# Patient Record
Sex: Female | Born: 1985 | Race: White | Hispanic: Yes | Marital: Married | Smoking: Never smoker
Health system: Southern US, Community
[De-identification: ages and names within clinical notes are randomized; demographics above are authoritative.]

## PROBLEM LIST (undated history)

## (undated) ENCOUNTER — Inpatient Hospital Stay (HOSPITAL_COMMUNITY): Payer: Self-pay

## (undated) DIAGNOSIS — H547 Unspecified visual loss: Secondary | ICD-10-CM

## (undated) DIAGNOSIS — G932 Benign intracranial hypertension: Secondary | ICD-10-CM

## (undated) DIAGNOSIS — R519 Headache, unspecified: Secondary | ICD-10-CM

## (undated) DIAGNOSIS — H9319 Tinnitus, unspecified ear: Secondary | ICD-10-CM

## (undated) DIAGNOSIS — R51 Headache: Principal | ICD-10-CM

## (undated) HISTORY — DX: Benign intracranial hypertension: G93.2

## (undated) HISTORY — DX: Unspecified visual loss: H54.7

## (undated) HISTORY — PX: OTHER SURGICAL HISTORY: SHX169

## (undated) HISTORY — DX: Headache: R51

## (undated) HISTORY — DX: Tinnitus, unspecified ear: H93.19

## (undated) HISTORY — DX: Headache, unspecified: R51.9

---

## 2006-06-11 ENCOUNTER — Emergency Department (HOSPITAL_COMMUNITY): Admission: EM | Admit: 2006-06-11 | Discharge: 2006-06-11 | Payer: Self-pay | Admitting: Emergency Medicine

## 2006-09-15 ENCOUNTER — Emergency Department (HOSPITAL_COMMUNITY): Admission: EM | Admit: 2006-09-15 | Discharge: 2006-09-16 | Payer: Self-pay | Admitting: Emergency Medicine

## 2006-09-16 ENCOUNTER — Emergency Department (HOSPITAL_COMMUNITY): Admission: EM | Admit: 2006-09-16 | Discharge: 2006-09-17 | Payer: Self-pay | Admitting: Emergency Medicine

## 2006-09-18 ENCOUNTER — Inpatient Hospital Stay (HOSPITAL_COMMUNITY): Admission: AD | Admit: 2006-09-18 | Discharge: 2006-09-18 | Payer: Self-pay | Admitting: Gynecology

## 2011-06-17 HISTORY — PX: OTHER SURGICAL HISTORY: SHX169

## 2012-01-21 ENCOUNTER — Emergency Department (HOSPITAL_COMMUNITY)
Admission: EM | Admit: 2012-01-21 | Discharge: 2012-01-21 | Disposition: A | Discharge status: Home / Self Care | Payer: Self-pay | Attending: Emergency Medicine | Admitting: Emergency Medicine

## 2012-01-21 ENCOUNTER — Encounter (HOSPITAL_COMMUNITY): Payer: Self-pay

## 2012-01-21 ENCOUNTER — Emergency Department (HOSPITAL_COMMUNITY): Payer: Self-pay

## 2012-01-21 DIAGNOSIS — G44209 Tension-type headache, unspecified, not intractable: Secondary | ICD-10-CM | POA: Insufficient documentation

## 2012-01-21 DIAGNOSIS — M542 Cervicalgia: Secondary | ICD-10-CM | POA: Insufficient documentation

## 2012-01-21 DIAGNOSIS — M25519 Pain in unspecified shoulder: Secondary | ICD-10-CM | POA: Insufficient documentation

## 2012-01-21 DIAGNOSIS — O99891 Other specified diseases and conditions complicating pregnancy: Secondary | ICD-10-CM | POA: Insufficient documentation

## 2012-01-21 DIAGNOSIS — O26899 Other specified pregnancy related conditions, unspecified trimester: Secondary | ICD-10-CM

## 2012-01-21 DIAGNOSIS — J3489 Other specified disorders of nose and nasal sinuses: Secondary | ICD-10-CM | POA: Insufficient documentation

## 2012-01-21 MED ORDER — CYCLOBENZAPRINE HCL 10 MG PO TABS
10.0000 mg | ORAL_TABLET | Freq: Once | ORAL | Status: AC
  Administered 2012-01-21: 10 mg via ORAL
  Filled 2012-01-21: qty 1

## 2012-01-21 MED ORDER — METOCLOPRAMIDE HCL 5 MG/ML IJ SOLN
10.0000 mg | Freq: Once | INTRAMUSCULAR | Status: AC
  Administered 2012-01-21: 10 mg via INTRAVENOUS
  Filled 2012-01-21: qty 2

## 2012-01-21 MED ORDER — SODIUM CHLORIDE 0.9 % IV BOLUS (SEPSIS)
1000.0000 mL | Freq: Once | INTRAVENOUS | Status: AC
  Administered 2012-01-21: 1000 mL via INTRAVENOUS

## 2012-01-21 MED ORDER — DIPHENHYDRAMINE HCL 50 MG/ML IJ SOLN
25.0000 mg | Freq: Once | INTRAMUSCULAR | Status: AC
  Administered 2012-01-21: 25 mg via INTRAVENOUS
  Filled 2012-01-21: qty 1

## 2012-01-21 MED ORDER — CYCLOBENZAPRINE HCL 10 MG PO TABS: 5.0000 mg | ORAL_TABLET | Freq: Two times a day (BID) | ORAL | Status: AC | PRN

## 2012-01-21 NOTE — ED Notes (Signed)
Pt reports decreased fetal movement in last couple of days.

## 2012-01-21 NOTE — Discharge Instructions (Signed)
You have a living fetus of 16weeks and 3 days.  Follow up at [facility] for further evaluation of your pregnancy.  Take flexeril for your tension headache as needed.    Tension Headache A tension headache is pain felt in the top of the head and back of the neck. Stress, feeling worried (anxiety), and feeling sad for a long time (depression) can cause these headaches. HOME CARE  Only take medicine as told by your doctor.   Relax by getting a massage or using your thoughts to control your body (biofeedback).   You may put ice or heat packs on the head and neck area.   Put ice in a plastic bag.   Place a towel between your skin and the bag.   Leave the ice on for 15 to 20 minutes, 3 to 4 times a day.   Try going to physical therapy.   You may need to make lifestyle changes if your headaches continue.   Avoid using too many pain killers. This can cause more headaches.  Finding out the results of your test Ask when your test results will be ready. Make sure you get your test results. GET HELP RIGHT AWAY IF:   You have problems with your medicines or they do not help.   Your headache changes or gets very bad.   You feel sick to your stomach (nauseous) or throw up (vomit).   You have a temperature by mouth above 102 F (38.9 C), not controlled by medicine.   You have a stiff neck, muscle weakness, or loss of muscle control.   You start having new problems.   You lose your balance, vision, or have trouble walking.   You feel faint or pass out.  MAKE SURE YOU:   Understand these instructions.   Will watch your condition.   Will get help right away if you are not doing well or get worse.  Document Released: 12/27/2009 Document Revised: 09/21/2011 Document Reviewed: 12/27/2009 [facility] Patient Information 2012 [place], [place].  ABCs of Pregnancy A Antepartum care is very important. Be sure you see your doctor and get prenatal care as soon as you think you are pregnant.  At this time, you will be tested for infection, genetic abnormalities and potential problems with you and the pregnancy. This is the time to discuss diet, exercise, work, medications, labor, pain medication during labor and the possibility of a cesarean delivery. Ask any questions that may concern you. It is important to see your doctor regularly throughout your pregnancy. Avoid exposure to toxic substances and chemicals - such as cleaning solvents, lead and mercury, some insecticides, and paint. Pregnant women should avoid exposure to paint fumes, and fumes that cause you to feel ill, dizzy or faint. When possible, it is a good idea to have a pre-pregnancy consultation with your caregiver to begin some important recommendations your caregiver suggests such as, taking folic acid, exercising, quitting smoking, avoiding alcoholic beverages, etc. B Breastfeeding is the healthiest choice for both you and your baby. It has many nutritional benefits for the baby and health benefits for the mother. It also creates a very tight and loving bond between the baby and mother. Talk to your doctor, your family and friends, and your employer about how you choose to feed your baby and how they can support you in your decision. Not all birth defects can be prevented, but a woman can take actions that may increase her chance of having a healthy baby. Many  birth defects happen very early in pregnancy, sometimes before a woman even knows she is pregnant. Birth defects or abnormalities of any child in your or the father's family should be discussed with your caregiver. Get a good support bra as your breast size changes. Wear it especially when you exercise and when nursing.  C Celebrate the news of your pregnancy with the your spouse/father and family. Childbirth classes are helpful to take for you and the spouse/father because it helps to understand what happens during the pregnancy, labor and delivery. Cesarean delivery should be  discussed with your doctor so you are prepared for that possibility. The pros and cons of circumcision if it is a boy, should be discussed with your pediatrician. Cigarette smoking during pregnancy can result in low birth weight babies. It has been associated with infertility, miscarriages, tubal pregnancies, infant death (mortality) and poor health (morbidity) in childhood. Additionally, cigarette smoking may cause long-term learning disabilities. If you smoke, you should try to quit before getting pregnant and not smoke during the pregnancy. Secondary smoke may also harm a mother and her developing baby. It is a good idea to ask people to stop smoking around you during your pregnancy and after the baby is born. Extra calcium is necessary when you are pregnant and is found in your prenatal vitamin, in dairy products, green leafy vegetables and in calcium supplements. D A healthy diet according to your current weight and height, along with vitamins and mineral supplements should be discussed with your caregiver. Domestic abuse or violence should be made known to your doctor right away to get the situation corrected. Drink more water when you exercise to keep hydrated. Discomfort of your back and legs usually develops and progresses from the middle of the second trimester through to delivery of the baby. This is because of the enlarging baby and uterus, which may also affect your balance. Do not take illegal drugs. Illegal drugs can seriously harm the baby and you. Drink extra fluids (water is best) throughout pregnancy to help your body keep up with the increases in your blood volume. Drink at least 6 to 8 glasses of water, fruit juice, or milk each day. A good way to know you are drinking enough fluid is when your urine looks almost like clear water or is very light yellow.  E Eat healthy to get the nutrients you and your unborn baby need. Your meals should include the five basic food groups. Exercise (30  minutes of light to moderate exercise a day) is important and encouraged during pregnancy, if there are no medical problems or problems with the pregnancy. Exercise that causes discomfort or dizziness should be stopped and reported to your caregiver. Emotions during pregnancy can change from being ecstatic to depression and should be understood by you, your partner and your family. F Fetal screening with ultrasound, amniocentesis and monitoring during pregnancy and labor is common and sometimes necessary. Take 400 micrograms of folic acid daily both before, when possible, and during the first few months of pregnancy to reduce the risk of birth defects of the brain and spine. All women who could possibly become pregnant should take a vitamin with folic acid, every day. It is also important to eat a healthy diet with fortified foods (enriched grain products, including cereals, rice, breads, and pastas) and foods with natural sources of folate (orange juice, green leafy vegetables, beans, peanuts, broccoli, asparagus, peas, and lentils). The father should be involved with all aspects of the pregnancy  including, the prenatal care, childbirth classes, labor, delivery, and postpartum time. Fathers may also have emotional concerns about being a father, financial needs, and raising a family. G Genetic testing should be done appropriately. It is important to know your family and the father's history. If there have been problems with pregnancies or birth defects in your family, report these to your doctor. Also, genetic counselors can talk with you about the information you might need in making decisions about having a family. You can call a major medical center in your area for help in finding a board-certified genetic counselor. Genetic testing and counseling should be done before pregnancy when possible, especially if there is a history of problems in the mother's or father's family. Certain ethnic backgrounds are more  at risk for genetic defects. H Get familiar with the hospital where you will be having your baby. Get to know how long it takes to get there, the labor and delivery area, and the hospital procedures. Be sure your medical insurance is accepted there. Get your home ready for the baby including, clothes, the baby's room (when possible), furniture and car seat. Hand washing is important throughout the day, especially after handling raw meat and poultry, changing the baby's diaper or using the bathroom. This can help prevent the spread of many bacteria and viruses that cause infection. Your hair may become dry and thinner, but will return to normal a few weeks after the baby is born. Heartburn is a common problem that can be treated by taking antacids recommended by your caregiver, eating smaller meals 5 or 6 times a day, not drinking liquids when eating, drinking between meals and raising the head of your bed 2 to 3 inches. I Insurance to cover you, the baby, doctor and hospital should be reviewed so that you will be prepared to pay any costs not covered by your insurance plan. If you do not have medical insurance, there are usually clinics and services available for you in your community. Take 30 milligrams of iron during your pregnancy as prescribed by your doctor to reduce the risk of low red blood cells (anemia) later in pregnancy. All women of childbearing age should eat a diet rich in iron. J There should be a joint effort for the mother, father and any other children to adapt to the pregnancy financially, emotionally, and psychologically during the pregnancy. Join a support group for moms-to-be. Or, join a class on parenting or childbirth. Have the family participate when possible. K Know your limits. Let your caregiver know if you experience any of the following:   Pain of any kind.   Strong cramps.   You develop a lot of weight in a short period of time (5 pounds in 3 to 5 days).   Vaginal  bleeding, leaking of amniotic fluid.   Headache, vision problems.   Dizziness, fainting, shortness of breath.   Chest pain.   Fever of 102 F (38.9 C) or higher.   Gush of clear fluid from your vagina.   Painful urination.   Domestic violence.   Irregular heartbeat (palpitations).   Rapid beating of the heart (tachycardia).   Constant feeling sick to your stomach (nauseous) and vomiting.   Trouble walking, fluid retention (edema).   Muscle weakness.   If your baby has decreased activity.   Persistent diarrhea.   Abnormal vaginal discharge.   Uterine contractions at 20-minute intervals.   Back pain that travels down your leg.  L Learn and practice that  what you eat and drink should be in moderation and healthy for you and your baby. Legal drugs such as alcohol and caffeine are important issues for pregnant women. There is no safe amount of alcohol a woman can drink while pregnant. Fetal alcohol syndrome, a disorder characterized by growth retardation, facial abnormalities, and central nervous system dysfunction, is caused by a woman's use of alcohol during pregnancy. Caffeine, found in tea, coffee, soft drinks and chocolate, should also be limited. Be sure to read labels when trying to cut down on caffeine during pregnancy. More than 200 foods, beverages, and over-the-counter medications contain caffeine and have a high salt content! There are coffees and teas that do not contain caffeine. M Medical conditions such as diabetes, epilepsy, and high blood pressure should be treated and kept under control before pregnancy when possible, but especially during pregnancy. Ask your caregiver about any medications that may need to be changed or adjusted during pregnancy. If you are currently taking any medications, ask your caregiver if it is safe to take them while you are pregnant or before getting pregnant when possible. Also, be sure to discuss any herbs or vitamins you are taking.  They are medicines, too! Discuss with your doctor all medications, prescribed and over-the-counter, that you are taking. During your prenatal visit, discuss the medications your doctor may give you during labor and delivery. N Never be afraid to ask your doctor or caregiver questions about your health, the progress of the pregnancy, family problems, stressful situations, and recommendation for a pediatrician, if you do not have one. It is better to take all precautions and discuss any questions or concerns you may have during your office visits. It is a good idea to write down your questions before you visit the doctor. O Over-the-counter cough and cold remedies may contain alcohol or other ingredients that should be avoided during pregnancy. Ask your caregiver about prescription, herbs or over-the-counter medications that you are taking or may consider taking while pregnant.  P Physical activity during pregnancy can benefit both you and your baby by lessening discomfort and fatigue, providing a sense of well-being, and increasing the likelihood of early recovery after delivery. Light to moderate exercise during pregnancy strengthens the belly (abdominal) and back muscles. This helps improve posture. Practicing yoga, walking, swimming, and cycling on a stationary bicycle are usually safe exercises for pregnant women. Avoid scuba diving, exercise at high altitudes (over 3000 feet), skiing, horseback riding, contact sports, etc. Always check with your doctor before beginning any kind of exercise, especially during pregnancy and especially if you did not exercise before getting pregnant. Q Queasiness, stomach upset and morning sickness are common during pregnancy. Eating a couple of crackers or dry toast before getting out of bed. Foods that you normally love may make you feel sick to your stomach. You may need to substitute other nutritious foods. Eating 5 or 6 small meals a day instead of 3 large ones may  make you feel better. Do not drink with your meals, drink between meals. Questions that you have should be written down and asked during your prenatal visits. R Read about and make plans to baby-proof your home. There are important tips for making your home a safer environment for your baby. Review the tips and make your home safer for you and your baby. Read food labels regarding calories, salt and fat content in the food. S Saunas, hot tubs, and steam rooms should be avoided while you are pregnant. Excessive high  heat may be harmful during your pregnancy. Your caregiver will screen and examine you for sexually transmitted diseases and genetic disorders during your prenatal visits. Learn the signs of labor. Sexual relations while pregnant is safe unless there is a medical or pregnancy problem and your caregiver advises against it. T Traveling long distances should be avoided especially in the third trimester of your pregnancy. If you do have to travel out of state, be sure to take a copy of your medical records and medical insurance plan with you. You should not travel long distances without seeing your doctor first. Most airlines will not allow you to travel after 36 weeks of pregnancy. Toxoplasmosis is an infection caused by a parasite that can seriously harm an unborn baby. Avoid eating undercooked meat and handling cat litter. Be sure to wear gloves when gardening. Tingling of the hands and fingers is not unusual and is due to fluid retention. This will go away after the baby is born. U Womb (uterus) size increases during the first trimester. Your kidneys will begin to function more efficiently. This may cause you to feel the need to urinate more often. You may also leak urine when sneezing, coughing or laughing. This is due to the growing uterus pressing against your bladder, which lies directly in front of and slightly under the uterus during the first few months of pregnancy. If you experience  burning along with frequency of urination or bloody urine, be sure to tell your doctor. The size of your uterus in the third trimester may cause a problem with your balance. It is advisable to maintain good posture and avoid wearing high heels during this time. An ultrasound of your baby may be necessary during your pregnancy and is safe for you and your baby. V Vaccinations are an important concern for pregnant women. Get needed vaccines before pregnancy. Center for Disease Control ([employer]) has clear guidelines for the use of vaccines during pregnancy. Review the list, be sure to discuss it with your doctor. Prenatal vitamins are helpful and healthy for you and the baby. Do not take extra vitamins except what is recommended. Taking too much of certain vitamins can cause overdose problems. Continuous vomiting should be reported to your caregiver. Varicose veins may appear especially if there is a family history of varicose veins. They should subside after the delivery of the baby. Support hose helps if there is leg discomfort. W Being overweight or underweight during pregnancy may cause problems. Try to get within 15 pounds of your ideal weight before pregnancy. Remember, pregnancy is not a time to be dieting! Do not stop eating or start skipping meals as your weight increases. Both you and your baby need the calories and nutrition you receive from a healthy diet. Be sure to consult with your doctor about your diet. There is a formula and diet plan available depending on whether you are overweight or underweight. Your caregiver or nutritionist can help and advise you if necessary. X Avoid X-rays. If you must have dental work or diagnostic tests, tell your dentist or physician that you are pregnant so that extra care can be taken. X-rays should only be taken when the risks of not taking them outweigh the risk of taking them. If needed, only the minimum amount of radiation should be used. When X-rays are  necessary, protective lead shields should be used to cover areas of the body that are not being X-rayed. Y Your baby loves you. Breastfeeding your baby creates a loving  and very close bond between the two of you. Give your baby a healthy environment to live in while you are pregnant. Infants and children require constant care and guidance. Their health and safety should be carefully watched at all times. After the baby is born, rest or take a nap when the baby is sleeping. Z Get your ZZZs. Be sure to get plenty of rest. Resting on your side as often as possible, especially on your left side is advised. It provides the best circulation to your baby and helps reduce swelling. Try taking a nap for 30 to 45 minutes in the afternoon when possible. After the baby is born rest or take a nap when the baby is sleeping. Try elevating your feet for that amount of time when possible. It helps the circulation in your legs and helps reduce swelling.  Most information courtesy of the CDC. Document Released: 10/02/2005 Document Revised: 09/21/2011 Document Reviewed: 06/16/2009 [facility] Patient Information 2012 [place], [place].

## 2012-01-21 NOTE — ED Notes (Signed)
Family at bedside. 

## 2012-01-21 NOTE — ED Notes (Signed)
Patient presents with right temporal headache that began this AM while driving.  Last menstrual period 09-12-11 and is pregnant.  Patient took 500mg  tylenol about 2 hours ago and reports the pain has not decreased.

## 2012-01-21 NOTE — ED Notes (Signed)
Report received from Dahlia Byes, RN. Pt moving to cdu#7 from str8.

## 2012-01-21 NOTE — ED Provider Notes (Signed)
Medical screening examination/treatment/procedure(s) were performed by non-physician practitioner and as supervising physician I was immediately available for consultation/collaboration.   Carleene Cooper III, MD 01/21/12 [id number]

## 2012-01-21 NOTE — ED Provider Notes (Signed)
History     CSN: [id number]  Arrival date & time 01/21/12  8841   First MD Initiated Contact with Patient 01/21/12 [phone]      Chief Complaint  Patient presents with  . Headache    (Consider location/radiation/quality/duration/timing/severity/associated sxs/prior treatment) HPI  G3 P2 patient who is currently 4 months pregnant presents with a chief complaint of headache. Patient describes a gradual onset headache for the past 2 days that is getting progressively worse. States headaches affect her temporal regions that radiates to the top of the head down towards the back of neck and shoulders.  Pt sts she rarely ever experience headache. Pain is described as sharp, constant, worsening with bright light or sound, and improved with nothing. She has been taking Tylenol for the past 2 days without adequate relief. She denies fever, chills, vision changes, chest pain, shortness of, abdominal pain, back pain, dysuria, rash. She denies any numbness or weakness. She does complains of occasional nausea and vomiting but accounts as to her pregnancy.  Patient states she is worried about her fetus, due to taking Tylenol. States takes 1000 mg daily for the past 2 days. She denies taking any other medication. She however states she can't feel the baby moving.  She denies any abnormal vaginal bleeding or abd pain.    History reviewed. No pertinent past medical history.  Past Surgical History  Procedure Date  . Cesarean section     Family History  Problem Relation Age of Onset  . Diabetes Mother     History  Substance Use Topics  . Smoking status: Never Smoker   . Smokeless tobacco: Not on file  . Alcohol Use: No    OB History    Grav Para Term Preterm Abortions TAB SAB Ect Mult Living   5    1     2       Review of Systems  All other systems reviewed and are negative.    Allergies  Review of patient's allergies indicates no known allergies.  Home Medications   Current Outpatient  Rx  Name Route Sig Dispense Refill  . ACETAMINOPHEN 500 MG PO TABS Oral Take 500 mg by mouth every 6 (six) hours as needed. For pain.      BP 132/73  Pulse 92  Temp(Src) 98.3 F (36.8 C) (Oral)  Resp 20  SpO2 96%  LMP 09/12/2011  Physical Exam  Nursing note and vitals reviewed. Constitutional: She is oriented to person, place, and time. She appears well-developed and well-nourished.       Awake, alert, nontoxic appearance but tearful.  HENT:  Head: Atraumatic.  Right Ear: External ear normal.  Left Ear: External ear normal.  Mouth/Throat: Oropharynx is clear and moist. No oropharyngeal exudate.       Rhinorrhea noted  Eyes: Conjunctivae are normal. Right eye exhibits no discharge. Left eye exhibits no discharge.  Neck: Neck supple.  Cardiovascular: Normal rate and regular rhythm.   Pulmonary/Chest: Effort normal. No respiratory distress. She exhibits no tenderness.  Abdominal: Soft. There is no tenderness. There is no rebound.       Gravid  Musculoskeletal: Normal range of motion. She exhibits no tenderness.       ROM appears intact, no obvious focal weakness  Neurological: She is alert and oriented to person, place, and time. She has normal strength. No cranial nerve deficit or sensory deficit. Coordination normal. GCS eye subscore is 4. GCS verbal subscore is 5. GCS motor subscore is 6.  Mental status and motor strength appears intact  Skin: No rash noted.  Psychiatric: She has a normal mood and affect.    ED Course  Procedures (including critical care time)  Labs Reviewed - No data to display No results found.   No diagnosis found.  No results found for this or any previous visit. US Ob Limited  01/21/2012  *RADIOLOGY REPORT*  Clinical Data: Decreased fetal activity.  Headaches.  LIMITED OBSTETRIC ULTRASOUND  Number of Fetuses: 1 Heart Rate: 150.bpm Movement: Yes Presentation: Cephalic Placental Location: Fundal and anterior Previa: No Amniotic Fluid  (Subjective): Normal  BPD: 3.4cm   16w   3d  MATERNAL FINDINGS: Cervix: Closed/ Uterus/Adnexae: The ovaries both appear normal.  IMPRESSION:  1.  Single living intrauterine pregnancy as above. 2.  No complicating features identified.  Recommend followup with non-emergent complete OB 14+ wk US examination for fetal biometric evaluation and anatomic survey. This could be performed at the [facility].  Original Report Authenticated By: Rosealee Albee, M.D.      MDM  Pt with tension headache during pregnancy.  Flexeril, benadryl, reglan and IVF given to abort headache.  Pt also request to have her fetus evaluated.  [place] ordered.  She is afebrile with stable normal VS.  No meningismal sign, low suspicion for meningitis.  I have consulted my attending and also care provider at [facility] MAU.     11:36 AM Pt sts her headache has improved with treatment.  Transvaginal US shows a single living intrauterine pregnancy.  Appears to be 35weeks and 90 days old.  Will recommend for pt to have f/u US at [facility].       Fayrene Helper, PA-C 01/21/12 1139

## 2012-02-02 ENCOUNTER — Inpatient Hospital Stay (HOSPITAL_COMMUNITY)
Admission: AD | Admit: 2012-02-02 | Discharge: 2012-02-02 | Disposition: A | Discharge status: Home / Self Care | Payer: Self-pay | Source: Ambulatory Visit | Attending: Obstetrics & Gynecology | Admitting: Obstetrics & Gynecology

## 2012-02-02 ENCOUNTER — Encounter (HOSPITAL_COMMUNITY): Payer: Self-pay | Admitting: *Deleted

## 2012-02-02 DIAGNOSIS — S29012A Strain of muscle and tendon of back wall of thorax, initial encounter: Secondary | ICD-10-CM

## 2012-02-02 DIAGNOSIS — R079 Chest pain, unspecified: Secondary | ICD-10-CM

## 2012-02-02 DIAGNOSIS — O0930 Supervision of pregnancy with insufficient antenatal care, unspecified trimester: Secondary | ICD-10-CM | POA: Insufficient documentation

## 2012-02-02 DIAGNOSIS — M549 Dorsalgia, unspecified: Secondary | ICD-10-CM | POA: Insufficient documentation

## 2012-02-02 DIAGNOSIS — O99891 Other specified diseases and conditions complicating pregnancy: Secondary | ICD-10-CM | POA: Insufficient documentation

## 2012-02-02 DIAGNOSIS — M545 Low back pain: Secondary | ICD-10-CM

## 2012-02-02 DIAGNOSIS — H538 Other visual disturbances: Secondary | ICD-10-CM | POA: Insufficient documentation

## 2012-02-02 LAB — DIFFERENTIAL
Basophils Absolute: 0 10*3/uL (ref 0.0–0.1)
Basophils Relative: 0 % (ref 0–1)
Eosinophils Absolute: 0 10*3/uL (ref 0.0–0.7)
Eosinophils Relative: 0 % (ref 0–5)
Lymphs Abs: 1.7 10*3/uL (ref 0.7–4.0)
Neutro Abs: 6 10*3/uL (ref 1.7–7.7)
Neutrophils Relative %: 73 % (ref 43–77)

## 2012-02-02 LAB — COMPREHENSIVE METABOLIC PANEL
AST: 13 U/L (ref 0–37)
BUN: 7 mg/dL (ref 6–23)
CO2: 24 mEq/L (ref 19–32)
Chloride: 98 mEq/L (ref 96–112)
Creatinine, Ser: 0.47 mg/dL — ABNORMAL LOW (ref 0.50–1.10)
GFR calc non Af Amer: >90 mL/min (ref >90)
Glucose, Bld: 77 mg/dL (ref 70–99)
Total Bilirubin: 0.7 mg/dL (ref 0.3–1.2)

## 2012-02-02 LAB — CBC
MCV: 81.6 fL (ref 78.0–100.0)
Platelets: 174 10*3/uL (ref 150–400)
RBC: 4.18 MIL/uL (ref 3.87–5.11)
RDW: 13.2 % (ref 11.5–15.5)
WBC: 8.2 10*3/uL (ref 4.0–10.5)

## 2012-02-02 LAB — URINALYSIS, ROUTINE W REFLEX MICROSCOPIC
Glucose, UA: 100 mg/dL — AB
Ketones, ur: 40 mg/dL — AB
Leukocytes, UA: NEGATIVE
Nitrite: NEGATIVE
pH: 7 (ref 5.0–8.0)

## 2012-02-02 LAB — LIPASE, BLOOD: Lipase: 38 U/L (ref 11–59)

## 2012-02-02 LAB — RAPID URINE DRUG SCREEN, HOSP PERFORMED: Benzodiazepines: NOT DETECTED

## 2012-02-02 MED ORDER — CYCLOBENZAPRINE HCL 5 MG PO TABS: 5.0000 mg | ORAL_TABLET | Freq: Three times a day (TID) | ORAL | Status: AC | PRN

## 2012-02-02 MED ORDER — HYDROMORPHONE HCL PF 1 MG/ML IJ SOLN
0.5000 mg | Freq: Once | INTRAMUSCULAR | Status: AC
  Administered 2012-02-02: 0.5 mg via INTRAMUSCULAR
  Filled 2012-02-02: qty 1

## 2012-02-02 MED ORDER — ONDANSETRON 4 MG PO TBDP
4.0000 mg | ORAL_TABLET | Freq: Once | ORAL | Status: AC
  Administered 2012-02-02: 4 mg via ORAL
  Filled 2012-02-02: qty 1

## 2012-02-02 MED ORDER — PROMETHAZINE HCL 12.5 MG PO TABS: 12.5000 mg | ORAL_TABLET | Freq: Four times a day (QID) | ORAL | Status: DC | PRN

## 2012-02-02 MED ORDER — OXYCODONE-ACETAMINOPHEN 5-325 MG PO TABS: 1.0000 | ORAL_TABLET | ORAL | Status: AC | PRN

## 2012-02-02 MED ORDER — GI COCKTAIL ~~LOC~~
30.0000 mL | Freq: Once | ORAL | Status: AC
  Administered 2012-02-02: 30 mL via ORAL
  Filled 2012-02-02: qty 30

## 2012-02-02 MED ORDER — ONDANSETRON HCL 4 MG/2ML IJ SOLN: 4.0000 mg | Freq: Once | INTRAMUSCULAR | Status: DC

## 2012-02-02 NOTE — Discharge Instructions (Signed)
Back Exercises   Back exercises help treat and prevent back injuries. The goal of back exercises is to increase the strength of your abdominal and back muscles and the flexibility of your back. These exercises should be started when you no longer have back pain. Back exercises include:   Pelvic Tilt. Lie on your back with your knees bent. Tilt your pelvis until the lower part of your back is against the floor. Hold this position 5 to 10 sec and repeat 5 to 10 times.   Knee to Chest. Pull first 1 knee up against your chest and hold for 20 to 30 seconds, repeat this with the other knee, and then both knees. This may be done with the other leg straight or bent, whichever feels better.   Sit-Ups or Curl-Ups. Bend your knees 90 degrees. Start with tilting your pelvis, and do a partial, slow sit-up, lifting your trunk only 30 to 45 degrees off the floor. Take at least 2 to 3 seconds for each sit-up. Do not do sit-ups with your knees out straight. If partial sit-ups are difficult, simply do the above but with only tightening your abdominal muscles and holding it as directed.   Hip-Lift. Lie on your back with your knees flexed 90 degrees. Push down with your feet and shoulders as you raise your hips a couple inches off the floor; hold for 10 seconds, repeat 5 to 10 times.   Back arches. Lie on your stomach, propping yourself up on bent elbows. Slowly press on your hands, causing an arch in your low back. Repeat 3 to 5 times. Any initial stiffness and discomfort should lessen with repetition over time.   Shoulder-Lifts. Lie face down with arms beside your body. Keep hips and torso pressed to floor as you slowly lift your head and shoulders off the floor.   Do not overdo your exercises, especially in the beginning. Exercises may cause you some mild back discomfort which lasts for a few minutes; however, if the pain is more severe, or lasts for more than 15 minutes, do not continue exercises until you see your caregiver.  Improvement with exercise therapy for back problems is slow.   See your caregivers for assistance with developing a proper back exercise program.   Document Released: 11/09/2004 Document Revised: 09/21/2011 Document Reviewed: 10/02/2005   ExitCare® Patient Information ©2012 [employer].     Back Pain, Adult   Low back pain is very common. About 1 in 5 people have back pain. The cause of low back pain is rarely dangerous. The pain often gets better over time. About half of people with a sudden onset of back pain feel better in just 2 weeks. About 8 in 10 people feel better by 6 weeks.   CAUSES   Some common causes of back pain include:   Strain of the muscles or ligaments supporting the spine.   Wear and tear (degeneration) of the spinal discs.   Arthritis.   Direct injury to the back.   DIAGNOSIS   Most of the time, the direct cause of low back pain is not known. However, back pain can be treated effectively even when the exact cause of the pain is unknown. Answering your caregiver's questions about your overall health and symptoms is one of the most accurate ways to make sure the cause of your pain is not dangerous. If your caregiver needs more information, he or she may order lab work or imaging tests (X-rays or MRIs). However, even   if imaging tests show changes in your back, this usually does not require surgery.   HOME CARE INSTRUCTIONS   For many people, back pain returns. Since low back pain is rarely dangerous, it is often a condition that people can learn to manage on their own.   Remain active. It is stressful on the back to sit or stand in one place. Do not sit, drive, or stand in one place for more than 30 minutes at a time. Take short walks on level surfaces as soon as pain allows. Try to increase the length of time you walk each day.   Do not stay in bed. Resting more than 1 or 2 days can delay your recovery.   Do not avoid exercise or work. Your body is made to move. It is not dangerous to be active,  even though your back may hurt. Your back will likely heal faster if you return to being active before your pain is gone.   Pay attention to your body when you bend and lift. Many people have less discomfort when lifting if they bend their knees, keep the load close to their bodies, and avoid twisting. Often, the most comfortable positions are those that put less stress on your recovering back.   Find a comfortable position to sleep. Use a firm mattress and lie on your side with your knees slightly bent. If you lie on your back, put a pillow under your knees.   Only take over-the-counter or prescription medicines as directed by your caregiver. Over-the-counter medicines to reduce pain and inflammation are often the most helpful. Your caregiver may prescribe muscle relaxant drugs. These medicines help dull your pain so you can more quickly return to your normal activities and healthy exercise.   Put ice on the injured area.   Put ice in a plastic bag.   Place a towel between your skin and the bag.   Leave the ice on for 15 to 20 minutes, 3 to 4 times a day for the first 2 to 3 days. After that, ice and heat may be alternated to reduce pain and spasms.   Ask your caregiver about trying back exercises and gentle massage. This may be of some benefit.   Avoid feeling anxious or stressed. Stress increases muscle tension and can worsen back pain. It is important to recognize when you are anxious or stressed and learn ways to manage it. Exercise is a great option.   SEEK MEDICAL CARE IF:   You have pain that is not relieved with rest or medicine.   You have pain that does not improve in 1 week.   You have new symptoms.   You are generally not feeling well.   SEEK IMMEDIATE MEDICAL CARE IF:   You have pain that radiates from your back into your legs.   You develop new bowel or bladder control problems.   You have unusual weakness or numbness in your arms or legs.   You develop nausea or vomiting.   You develop abdominal  pain.   You feel faint.   Document Released: 10/02/2005 Document Revised: 09/21/2011 Document Reviewed: 02/20/2011   ExitCare® Patient Information ©2012 [employer].

## 2012-02-02 NOTE — MAU Note (Signed)
Pt states, " I have been vomiting for two weeks every time I eat something.I also started having a headache with pain going to the back of my head, my upper right chest and the upper right back after I started vomiting.Up till then I had not had any vomiting. Today, I've vomited twice and I still have the pain in my chest and back and I feel like I am going to pass out when I walk. Everything goes black."

## 2012-02-02 NOTE — MAU Note (Addendum)
Present with back pain and continued worsing of loss of sight, has been worked up in ED with CT scan Gets care in [facility] C/o back pain right flank area Kerrie Buffalo, NP called to triage room

## 2012-02-02 NOTE — MAU Provider Note (Signed)
Valerie Rodriguez25 y.G.[id number] @[redacted]w[redacted]d  BEGA by [place] at [redacted]w[redacted]d Chief Complaint  Patient presents with  . Loss of Vision    continuous since ED eval with CT scan  . Back Pain  . Emesis During Pregnancy     First Provider Initiated Contact with Patient 02/02/12 1703      SUBJECTIVE  HPI:  She describes a two-week history right upper back pain radiating to her right upper abdomen at  costal margin. The pain is more severe and sharp at times. It is not pleuritic or related to injury or movement. Not associated with SOB or cough. Denies heartburn but has nausea/vomiting x 2 wks..  Denies hx cholecystitis or food intolerances. Also has had blurry vision for 2 wks. She lives in [place] and has been seen twice in EDs there for this and states she had a neg head CT and a neg CXR. Also seen at [facility] ED and treated symptomatically. Denies LOF or VB. Has FM.    Past Medical History  Diagnosis Date  . No pertinent past medical history    Past Surgical History  Procedure Date  . Cesarean section   . Open fracture right ankle Sept 2012     pins   History   Social History  . Marital Status: Married    Spouse Name: N/A    Number of Children: N/A  . Years of Education: N/A   Occupational History  . Not on file.   Social History Main Topics  . Smoking status: Never Smoker   . Smokeless tobacco: Not on file  . Alcohol Use: No  . Drug Use: No  . Sexually Active:    Other Topics Concern  . Not on file   Social History Narrative  . No narrative on file   No current facility-administered medications on file prior to encounter.   Current Outpatient Prescriptions on File Prior to Encounter  Medication Sig Dispense Refill  . acetaminophen (TYLENOL) 500 MG tablet Take 500 mg by mouth every 6 (six) hours as needed. For pain.       No Known Allergies  ROS: Pertinent items in HPI  OBJECTIVE Blood pressure 99/55, pulse 82, temperature 97.1 F (36.2 C), resp. rate 32, height 5\' 3"  (1.6 m), weight  90.719 kg (200 lb), last menstrual period 09/12/2011, SpO2 100.00%. GENERAL: Well-developed, well-nourished female in apparent pain  ABDOMEN: Soft, S=D, nontender in lower abd,tender RUQ ND, BS present, equivocal Murphy's sign EXTREMITIES: Nontender, no edema BACK: neg CVAT, mod tender rt subscapular region  FHR DT 150  LAB RESULTS Results for orders placed during the hospital encounter of 02/02/12 (from the past 24 hour(s))  URINALYSIS, ROUTINE W REFLEX MICROSCOPIC     Status: Abnormal   Collection Time   02/02/12  4:50 PM      Component Value Range   Color, Urine AMBER (*) YELLOW    APPearance CLEAR  CLEAR    Specific Gravity, Urine 1.020  1.005 - 1.030    pH 7.0  5.0 - 8.0    Glucose, UA 100 (*) NEGATIVE (mg/dL)   Hgb urine dipstick NEGATIVE  NEGATIVE    Bilirubin Urine SMALL (*) NEGATIVE    Ketones, ur 40 (*) NEGATIVE (mg/dL)   Protein, ur NEGATIVE  NEGATIVE (mg/dL)   Urobilinogen, UA 4.0 (*) 0.0 - 1.0 (mg/dL)   Nitrite NEGATIVE  NEGATIVE    Leukocytes, UA NEGATIVE  NEGATIVE   URINE RAPID DRUG SCREEN (HOSP PERFORMED)     Status: Normal  Collection Time   02/02/12  4:50 PM      Component Value Range   Opiates NONE DETECTED  NONE DETECTED    Cocaine NONE DETECTED  NONE DETECTED    Benzodiazepines NONE DETECTED  NONE DETECTED    Amphetamines NONE DETECTED  NONE DETECTED    Tetrahydrocannabinol NONE DETECTED  NONE DETECTED    Barbiturates NONE DETECTED  NONE DETECTED   CBC     Status: Abnormal   Collection Time   02/02/12  5:07 PM      Component Value Range   WBC 8.2  4.0 - 10.5 (K/uL)   RBC 4.18  3.87 - 5.11 (MIL/uL)   Hemoglobin 11.5 (*) 12.0 - 15.0 (g/dL)   HCT 40.9 (*) 81.1 - 46.0 (%)   MCV 81.6  78.0 - 100.0 (fL)   MCH 27.5  26.0 - 34.0 (pg)   MCHC 33.7  30.0 - 36.0 (g/dL)   RDW 91.4  78.2 - 95.6 (%)   Platelets 174  150 - 400 (K/uL)  DIFFERENTIAL     Status: Normal   Collection Time   02/02/12  5:07 PM      Component Value Range   Neutrophils Relative 73  43 -  77 (%)   Neutro Abs 6.0  1.7 - 7.7 (K/uL)   Lymphocytes Relative 21  12 - 46 (%)   Lymphs Abs 1.7  0.7 - 4.0 (K/uL)   Monocytes Relative 6  3 - 12 (%)   Monocytes Absolute 0.5  0.1 - 1.0 (K/uL)   Eosinophils Relative 0  0 - 5 (%)   Eosinophils Absolute 0.0  0.0 - 0.7 (K/uL)   Basophils Relative 0  0 - 1 (%)   Basophils Absolute 0.0  0.0 - 0.1 (K/uL)  COMPREHENSIVE METABOLIC PANEL     Status: Abnormal   Collection Time   02/02/12  5:07 PM      Component Value Range   Sodium 134 (*) 135 - 145 (mEq/L)   Potassium 3.4 (*) 3.5 - 5.1 (mEq/L)   Chloride 98  96 - 112 (mEq/L)   CO2 24  19 - 32 (mEq/L)   Glucose, Bld 77  70 - 99 (mg/dL)   BUN 7  6 - 23 (mg/dL)   Creatinine, Ser 2.13 (*) 0.50 - 1.10 (mg/dL)   Calcium 9.5  8.4 - 08.6 (mg/dL)   Total Protein 7.0  6.0 - 8.3 (g/dL)   Albumin 3.4 (*) 3.5 - 5.2 (g/dL)   AST 13  0 - 37 (U/L)   ALT 9  0 - 35 (U/L)   Alkaline Phosphatase 44  39 - 117 (U/L)   Total Bilirubin 0.7  0.3 - 1.2 (mg/dL)   GFR calc non Af Amer >90  >90 (mL/min)   GFR calc Af Amer >90  >90 (mL/min)  AMYLASE     Status: Normal   Collection Time   02/02/12  5:07 PM      Component Value Range   Amylase 45  0 - 105 (U/L)  LIPASE, BLOOD     Status: Normal   Collection Time   02/02/12  5:07 PM      Component Value Range   Lipase 38  11 - 59 (U/L)    MAU course: GI cocktail ineffective. Dilaudid .5 mg with relief. No vomiting while in MAU   ASSESSMENT [id number] at [redacted]w[redacted]d NPC Upper back and CP probably musculoskeletal origin Blurry vision of unclear etiology  PLAN  Continue Tylenol and PNVs Rx  Flexeril and Percocet (#12) Urged to start [facility] (lives in [place]) See opthalmologist if visual sx persist     Valerie Wong 02/02/2012 5:23 PM

## 2012-02-02 NOTE — MAU Note (Deleted)
Dr Henderson Cloud at bedside with patient

## 2013-06-23 ENCOUNTER — Ambulatory Visit (INDEPENDENT_AMBULATORY_CARE_PROVIDER_SITE_OTHER): Payer: Medicaid Other | Admitting: Neurology

## 2013-06-23 ENCOUNTER — Encounter: Payer: Self-pay | Admitting: Neurology

## 2013-06-23 VITALS — BP 122/73 | HR 70 | Ht 62.0 in | Wt 186.0 lb

## 2013-06-23 DIAGNOSIS — H9313 Tinnitus, bilateral: Secondary | ICD-10-CM

## 2013-06-23 DIAGNOSIS — H547 Unspecified visual loss: Secondary | ICD-10-CM

## 2013-06-23 DIAGNOSIS — H9319 Tinnitus, unspecified ear: Secondary | ICD-10-CM

## 2013-06-23 DIAGNOSIS — R519 Headache, unspecified: Secondary | ICD-10-CM | POA: Insufficient documentation

## 2013-06-23 DIAGNOSIS — R51 Headache: Secondary | ICD-10-CM

## 2013-06-23 NOTE — Progress Notes (Signed)
[facility]  PATIENT: Valerie Wong DOB: April 29, 1986  HISTORICAL [place] is a 27 years old right-handed female, referred by her primary care physician for evaluation of bilateral visual loss  She was previously healthy, G2P3, during her third pregnancy in 2013, she developed constant headaches, tinnitus, low back pain, progressive vision loss, left more than right eye since March 2013, this was during her second trimester, she was evaluated by ophthalmologist Dr. Hollice Espy, Prof. 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 Stenosis of bilateral sigmoid sinus, at the junction with transverse sinus, no evidence of acute hemorrhage or mass lesion, CTV of head showed no evidence of thrombosis, hypoplastic right transverse sinus, severe stenosis at the junction of the right transverse and sigmoid sinus, severe stenosis at the junction of left transverse and sigmoid sinus,  She underwent LP, with OP 35.5 she was started on Diamox 250 mg 2 tablets 4 times a day, but with continued vision loss, she had left optic nerve fenetration in May 2914, with transient improvement in her left vision, followup visual filed testing also demonstrated dramatic improvement,  She moved from [place] to [place] about 6 months ago, she now began to experience worsening visual loss again, she could only see very large print  through her left eye, very blurred right eye vision, continued to have frequent right frontal parietal headaches, tinnitus,  She was evaluated by ophthalmologist Dr. Aura Camps in May 26 2013, visual acuity right eye 20/200, left eye 20/300, there was no longer papillary edema,, but has 3 plus optic atrophy bilaterally,    REVIEW OF SYSTEMS: Full 14 system review of systems performed and notable only for ringing in ears, loss of vision, hedache  ALLERGIES: No Known Allergies  HOME MEDICATIONS: Outpatient Prescriptions Prior to Visit  Medication Sig Dispense Refill  . acetaminophen (TYLENOL) 500 MG tablet Take 500 mg by mouth every 6 (six) hours as needed. For pain.      . Prenatal Vit-Fe Fumarate-FA (PRENATAL MULTIVITAMIN) TABS Take 1 tablet by mouth at bedtime.      . promethazine (PHENERGAN) 12.5 MG tablet Take 1 tablet (12.5 mg total) by mouth every 6 (six) hours as needed for nausea.  30 tablet  0     PAST MEDICAL HISTORY: Past Medical History  Diagnosis Date  . HA (headache)   . Loss of vision   . Ringing in ears     PAST SURGICAL HISTORY: Past Surgical History  Procedure Laterality Date  . Cesarean section    . Open fracture right ankle  Sept 2012     pins  . Left eye      2013    FAMILY HISTORY: Family History  Problem Relation Age of Onset  . Diabetes Mother   . Anesthesia problems Neg Hx   . Hypotension Neg Hx   . Malignant hyperthermia Neg Hx   . Pseudochol deficiency Neg Hx   . Asthma Mother     SOCIAL HISTORY:  History  Social History  . Marital Status: Married    Spouse Name: N/A    Number of Children: 3  . Years of Education: 11   Occupational History  .      Does not work.   Social History Main Topics  . Smoking status: Never Smoker   . Smokeless tobacco: Never Used  . Alcohol Use: No  . Drug Use: No  . Sexual Activity: Not on file   Other Topics Concern  . Not on file   Social History Narrative   Patient lives at home  with her friend. Patient does not work. 11 th grade education. Patient has three children.    Caffeine- None   Right handed     PHYSICAL EXAM  Filed Vitals:   06/23/13 0753  BP: 122/73  Pulse: 70  Height: 5\' 2"  (1.575 m)  Weight: 186 lb (84.369 kg)    Not recorded    Body mass index is 34.01 kg/(m^2).   Generalized: In no acute distress  Neck: Supple, no carotid bruits   Cardiac: Regular rate rhythm  Pulmonary: Clear to auscultation bilaterally  Musculoskeletal: No deformity  Neurological examination  Mentation: Alert oriented to time, place, history taking, and causual conversation  Cranial nerve II-XII: Pupils were equal round reactive to light extraocular movements were full, visual field were full on confrontational test, bilateral disc atrophy.She could only read very large print left eye, moderate print with right eye.  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   facial sensation and strength were normal. hearing was intact to finger rubbing bilaterally. Uvula tongue midline.  head turning and shoulder shrug and were normal and symmetric.Tongue protrusion into cheek strength was normal.  Motor: normal tone, bulk and strength.  Sensory: Intact to fine touch, pinprick, preserved vibratory sensation, and proprioception at toes.  Coordination: Normal finger to nose, heel-to-shin bilaterally there was no truncal ataxia  Gait: Rising up from seated position without assistance, normal stance, without trunk ataxia, moderate stride, good arm swing, smooth turning, able to perform tiptoe, and heel walking without difficulty.   Romberg signs: Negative  Deep tendon reflexes: Brachioradialis 2/2, biceps 2/2, triceps 2/2, patellar 2/2, Achilles 2/2, plantar responses were flexor bilaterally.   DIAGNOSTIC DATA (LABS, IMAGING, TESTING) - I reviewed patient records, labs, notes, testing and imaging myself where available.  Lab Results  Component Value Date   WBC 8.2 02/02/2012   HGB 11.5*  02/02/2012   HCT 34.1* 02/02/2012   MCV 81.6 02/02/2012   PLT 174 02/02/2012      Component Value Date/Time   NA 134* 02/02/2012 1707   K 3.4* 02/02/2012 1707   CL 98 02/02/2012 1707   CO2 24 02/02/2012 1707   GLUCOSE 77 02/02/2012 1707   BUN 7 02/02/2012 1707   CREATININE 0.47* 02/02/2012 1707   CALCIUM 9.5 02/02/2012 1707   PROT 7.0 02/02/2012 1707   ALBUMIN 3.4* 02/02/2012 1707   AST 13 02/02/2012 1707   ALT 9 02/02/2012 1707   ALKPHOS 44 02/02/2012 1707   BILITOT 0.7 02/02/2012 1707   GFRNONAA >90 02/02/2012 1707   GFRAA >90 02/02/2012 1707    ASSESSMENT AND PLAN   27 years old female, with bilateral optic disc atrophy, previous severe bilateral papillary edema during her pregnancy, continued gradual worsening visual loss.  1 her visual loss is most likely due to permanent visual nerve damage from her prolonged severe bilateral papillary edema due to pseudotumor cerebri. 2.complete evaluation with repeat MRI of the brain, MRV of the  brain,  3.  fluoroscopy guided lumbar puncture,  4 laboratory evaluation,  5 refer her to neurophthalmologist Dr. Gentry Roch at [facility] .           Time spend in coordinating her care and reviewing notes, face to face consultation was 60 minutes  Levert Feinstein, M.D. Ph.D.  [facility] [address] [place], [place] [address] [phone]

## 2013-06-25 LAB — LYME, TOTAL AB TEST/REFLEX: Lyme IgG/IgM Ab: <0.91 {ISR} (ref 0.00–0.90)

## 2013-06-25 LAB — COMPREHENSIVE METABOLIC PANEL
Albumin: 4.6 g/dL (ref 3.5–5.5)
Alkaline Phosphatase: 61 IU/L (ref 39–117)
BUN/Creatinine Ratio: 14 (ref 8–20)
BUN: 10 mg/dL (ref 6–20)
CO2: 21 mmol/L (ref 18–29)
Creatinine, Ser: 0.73 mg/dL (ref 0.57–1.00)
Globulin, Total: 2.8 g/dL (ref 1.5–4.5)
Total Protein: 7.4 g/dL (ref 6.0–8.5)

## 2013-06-25 LAB — CBC
Hemoglobin: 12.7 g/dL (ref 11.1–15.9)
WBC: 5.4 10*3/uL (ref 3.4–10.8)

## 2013-06-25 LAB — HIV ANTIBODY (ROUTINE TESTING W REFLEX): HIV-1/HIV-2 Ab: NONREACTIVE

## 2013-06-25 LAB — VITAMIN B12: Vitamin B-12: 480 pg/mL (ref 211–946)

## 2013-06-25 LAB — C-REACTIVE PROTEIN: CRP: 1.8 mg/L (ref 0.0–4.9)

## 2013-06-25 LAB — THYROID PANEL WITH TSH: TSH: 1.95 u[IU]/mL (ref 0.450–4.500)

## 2013-06-26 ENCOUNTER — Telehealth: Payer: Self-pay | Admitting: *Deleted

## 2013-06-26 NOTE — Telephone Encounter (Signed)
Spoke to patient and she is aware of normal lab results.

## 2013-06-26 NOTE — Telephone Encounter (Signed)
Message copied by Ardeth Sportsman on Thu Jun 26, 2013  9:30 AM ------      Message from: Valerie Wong      Created: Wed Jun 25, 2013  8:31 AM       Please call patient for normal laboratory result ------

## 2013-07-25 ENCOUNTER — Ambulatory Visit: Payer: Medicaid Other | Admitting: Occupational Therapy

## 2013-07-28 ENCOUNTER — Ambulatory Visit: Payer: Medicaid Other | Admitting: Neurology

## 2013-08-18 ENCOUNTER — Telehealth: Payer: Self-pay | Admitting: Neurology

## 2013-08-27 ENCOUNTER — Ambulatory Visit: Payer: Medicaid Other | Admitting: Neurology

## 2013-08-28 NOTE — Telephone Encounter (Signed)
Valerie:  Please call patient that I only saw her once in Sept 8th 2014, she did not follow through with the suggested lumbar puncture, she had extensive evaluation with her previous ophthalmologist, she should get a more complete record from her ophthalmologist.

## 2013-08-29 NOTE — Telephone Encounter (Signed)
I returned patient's call. Unable to leave message. I called emergency number and left message: We are unable to write a letter for patient to not drive. Please make the request from the primary physician. Also, please follow through with recommendations of Dr. Terrace Arabia. Please call [phone] with any further questions.

## 2013-09-01 ENCOUNTER — Ambulatory Visit (INDEPENDENT_AMBULATORY_CARE_PROVIDER_SITE_OTHER): Payer: Medicaid Other | Admitting: Neurology

## 2013-09-01 ENCOUNTER — Encounter: Payer: Self-pay | Admitting: Neurology

## 2013-09-01 VITALS — BP 100/64 | HR 65 | Ht 62.0 in | Wt 194.0 lb

## 2013-09-01 DIAGNOSIS — H547 Unspecified visual loss: Secondary | ICD-10-CM

## 2013-09-01 DIAGNOSIS — R51 Headache: Secondary | ICD-10-CM

## 2013-09-01 DIAGNOSIS — G932 Benign intracranial hypertension: Secondary | ICD-10-CM | POA: Insufficient documentation

## 2013-09-01 MED ORDER — TOPIRAMATE 50 MG PO TABS: ORAL_TABLET | ORAL | Status: DC

## 2013-09-01 NOTE — Progress Notes (Signed)
[facility]  PATIENT: Valerie Wong DOB: 06/08/1986  HISTORICAL Valerie Wong is a 27 years old right-handed female, referred by her primary care physician for evaluation of bilateral visual loss  She was previously healthy, G2P3, during her third pregnancy in 2013, she developed constant headaches, tinnitus, low back pain, progressive vision loss, left more than right eye since March 2013, this was during her second trimester, she was evaluated by ophthalmologist Dr. Hollice Espy, Prof. 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 Stenosis of bilateral sigmoid sinus, at the junction with transverse sinus, no evidence of acute hemorrhage or mass lesion, CTV of head showed no evidence of thrombosis, hypoplastic right transverse sinus, severe stenosis at the junction of the right transverse and sigmoid sinus, severe stenosis at the junction of left transverse and sigmoid sinus,  She underwent LP, with OP 35.5 she was started on Diamox 250 mg 2 tablets 4 times a day, but with continued vision loss, she had left optic nerve fenetration in May 2014, with transient improvement in her left vision, followup visual filed testing also demonstrated dramatic improvement,  She moved from [place] to [place] in Spring 2014, she now began to experience worsening visual loss again, she could only see very large print through  her left eye, very blurred right eye vision, continued to have frequent right frontal parietal headaches, tinnitus,  She was evaluated by ophthalmologist Dr. Aura Camps in May 26 2013, visual acuity right eye 20/200, left eye 20/300, there was no longer papillary edema,, but has 3 plus optic atrophy bilaterally  UPDATE 09/01/2013:  She is now evaluated by opthamologist Dr. Lita Mains, at [facility], will be seen again in Feb 2015. She continues to have very poor vision, right side is slightly better than the left side, she is the main caregiver of her 3 children at age 74, 26, and 1.  She is in the process of applying for disability, I filled in the paperwork for her, she complains of frequent right parietal area headaches, sharp transient pain, multiple times a day, Tylenol does not help  Extensive laboratory evaluation demonstrated normal or negative RPR, B12, CMP, HIV, folic acid, C. reactive protein, TSH, CBC, Lyme titer, ANA,   REVIEW OF SYSTEMS: Full 14 system review of systems performed and notable only for ringing in ears, loss of vision, hedache  ALLERGIES: No Known Allergies  HOME MEDICATIONS: Outpatient Prescriptions Prior to Visit  Medication Sig Dispense Refill  . acetaminophen (TYLENOL) 500 MG tablet Take 500 mg by mouth every 6 (six) hours as needed. For pain.      . Prenatal Vit-Fe Fumarate-FA (PRENATAL MULTIVITAMIN) TABS Take 1 tablet by mouth at bedtime.      . promethazine (PHENERGAN) 12.5 MG tablet Take 1 tablet (12.5 mg total) by mouth every 6 (six) hours as needed for nausea.  30 tablet  0     PAST MEDICAL HISTORY: Past Medical History  Diagnosis Date  . HA (headache)   . Loss  of vision   . Ringing in ears     PAST SURGICAL HISTORY: Past Surgical History  Procedure Laterality Date  . Cesarean section    . Open fracture right ankle  Sept 2012     pins  . Left eye      2013    FAMILY HISTORY: Family History  Problem Relation Age of Onset  .  Diabetes Mother   . Anesthesia problems Neg Hx   . Hypotension Neg Hx   . Malignant hyperthermia Neg Hx   . Pseudochol deficiency Neg Hx   . Asthma Mother     SOCIAL HISTORY:  History   Social History  . Marital Status: Married    Spouse Name: N/A    Number of Children: 3  . Years of Education: 11   Occupational History  .      Does not work.   Social History Main Topics  . Smoking status: Never Smoker   . Smokeless tobacco: Never Used  . Alcohol Use: No  . Drug Use: No  . Sexual Activity: Not on file   Other Topics Concern  . Not on file   Social History Narrative   Patient lives at home with her friend. Patient does not work. 11 th grade education. Patient has three children.    Caffeine- None   Right handed     PHYSICAL EXAM  Filed Vitals:   09/01/13 1303  BP: 100/64  Pulse: 65  Height: 5\' 2"  (1.575 m)  Weight: 194 lb (87.998 kg)    Not recorded    Body mass index is 35.47 kg/(m^2).   Generalized: In no acute distress  Neck: Supple, no carotid bruits   Cardiac: Regular rate rhythm  Pulmonary: Clear to auscultation bilaterally  Musculoskeletal: No deformity  Neurological examination  Mentation: Alert oriented to time, place, history taking, and causual conversation  Cranial nerve II-XII: Pupils were equal round reactive to light extraocular movements were full, visual field were full on confrontational test, bilateral disc atrophy.She could only read very large print left eye, moderate print with right eye.  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   facial sensation and strength were normal. hearing was intact to finger rubbing bilaterally. Uvula tongue midline.  head turning and shoulder shrug and were normal and symmetric.Tongue protrusion into cheek strength was normal.  Motor: normal tone, bulk and strength.  Sensory: Intact to fine touch, pinprick, preserved vibratory sensation, and proprioception at toes.  Coordination: Normal finger to nose,  heel-to-shin bilaterally there was no truncal ataxia  Gait: Rising up from seated position without assistance, normal stance, without trunk ataxia, moderate stride, good arm swing, smooth turning, able to perform tiptoe, and heel walking without difficulty.   Romberg signs: Negative  Deep tendon reflexes: Brachioradialis 2/2, biceps 2/2, triceps 2/2, patellar 2/2, Achilles 2/2, plantar responses were flexor bilaterally.   DIAGNOSTIC DATA (LABS, IMAGING, TESTING) - I reviewed patient records, labs, notes, testing and imaging myself where available.  Lab Results  Component Value Date   WBC 5.4 06/23/2013   HGB 12.7 06/23/2013   HCT 37.4 06/23/2013   MCV 81 06/23/2013   PLT 214 06/23/2013      Component Value Date/Time   NA 139 06/23/2013 0908   NA 134* 02/02/2012 1707   K 4.1 06/23/2013 0908   CL 102 06/23/2013 0908   CO2 21 06/23/2013 0908   GLUCOSE 82 06/23/2013 0908   GLUCOSE 77 02/02/2012 1707   BUN 10 06/23/2013 0908   BUN  7 02/02/2012 1707   CREATININE 0.73 06/23/2013 0908   CALCIUM 9.2 06/23/2013 0908   PROT 7.4 06/23/2013 0908   PROT 7.0 02/02/2012 1707   ALBUMIN 3.4* 02/02/2012 1707   AST 17 06/23/2013 0908   ALT 9 06/23/2013 0908   ALKPHOS 61 06/23/2013 0908   BILITOT 0.2 06/23/2013 0908   GFRNONAA 114 06/23/2013 0908   GFRAA 131 06/23/2013 0908    ASSESSMENT AND PLAN   27 years old female, with bilateral optic disc atrophy, previous severe bilateral papillary edema during her pregnancy, continued gradual worsening visual loss.  Her visual loss is most likely due to permanent visual nerve damage from her prolonged severe bilateral papillary edema due to pseudotumor cerebri, she  Now has prominent bilateral optic nerve atrophy, very poor vision, frequent headaches.         I will start Topamax as migraine prevention.  She is to return to clinic in 6 months with Gerlene Fee, M.D. Ph.D.  [facility] [address] [place], [place] [address] [phone]

## 2014-03-02 ENCOUNTER — Telehealth: Payer: Self-pay | Admitting: Nurse Practitioner

## 2014-03-02 ENCOUNTER — Ambulatory Visit: Payer: Medicaid Other | Admitting: Nurse Practitioner

## 2014-03-02 NOTE — Telephone Encounter (Signed)
No show for scheduled appt 

## 2014-08-04 ENCOUNTER — Encounter: Payer: Self-pay | Admitting: Neurology

## 2014-08-04 ENCOUNTER — Encounter (INDEPENDENT_AMBULATORY_CARE_PROVIDER_SITE_OTHER): Payer: Self-pay

## 2014-08-04 ENCOUNTER — Ambulatory Visit (INDEPENDENT_AMBULATORY_CARE_PROVIDER_SITE_OTHER): Payer: Medicaid Other | Admitting: Neurology

## 2014-08-04 VITALS — BP 110/75 | HR 62 | Ht 63.0 in | Wt 192.0 lb

## 2014-08-04 DIAGNOSIS — R51 Headache: Secondary | ICD-10-CM

## 2014-08-04 DIAGNOSIS — H9313 Tinnitus, bilateral: Secondary | ICD-10-CM

## 2014-08-04 DIAGNOSIS — G8929 Other chronic pain: Secondary | ICD-10-CM

## 2014-08-04 DIAGNOSIS — G932 Benign intracranial hypertension: Secondary | ICD-10-CM

## 2014-08-04 DIAGNOSIS — H547 Unspecified visual loss: Secondary | ICD-10-CM

## 2014-08-04 MED ORDER — TOPIRAMATE 100 MG PO TABS: 100.0000 mg | ORAL_TABLET | Freq: Two times a day (BID) | ORAL | Status: DC

## 2014-08-04 MED ORDER — RIZATRIPTAN BENZOATE 5 MG PO TBDP: 5.0000 mg | ORAL_TABLET | ORAL | Status: DC | PRN

## 2014-08-04 NOTE — Progress Notes (Signed)
[facility]  PATIENT: Valerie Wong DOB: [birth year]  HISTORICAL Porfirio MylarCarmen is a 28 years old right-handed female, referred by her primary care physician for evaluation of bilateral visual loss  She was previously healthy, G2P3, during her third pregnancy in 2013, she developed constant headaches, tinnitus, low back pain, progressive vision loss, left more than right eye since March 2013, this was during her second trimester, she was evaluated by ophthalmologist Dr. Hollice EspyStephen Newman, Prof. 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 Stenosis of bilateral sigmoid sinus, at the junction with transverse sinus, no evidence of acute hemorrhage or mass lesion, CTV of head showed no evidence of thrombosis, hypoplastic right transverse sinus, severe stenosis at the junction of the right transverse and sigmoid sinus, severe stenosis at the junction of left transverse and sigmoid sinus,  She underwent LP, with OP 35.5 she was started on Diamox 250 mg 2 tablets 4 times a day, but with continued vision loss, she had left optic nerve fenetration in May 2014, with transient improvement in her left vision, followup visual filed testing also demonstrated dramatic improvement,  She moved from [place] to [place] in Spring 2014, she now began to experience worsening visual loss again, she could only see very large print through  her left eye, very blurred right eye vision, continued to have frequent right frontal parietal headaches, tinnitus,  She was evaluated by ophthalmologist Dr. Aura CampsMichael Spencer in May 26 2013, visual acuity right eye 20/200, left eye 20/300, there was no longer papillary edema,, but has 3 plus optic atrophy bilaterally  UPDATE 09/01/2013:  She is now evaluated by opthamologist Dr. Lita MainsHaines, at [facility], will be seen again in Feb 2015. She continues to have very poor vision, right side is slightly better than the left side, she is the main caregiver of her 3 children at age 677, 293, and 1.  She is in the process of applying for disability, I filled in the paperwork for her, she complains of frequent right parietal area headaches, sharp transient pain, multiple times a day, Tylenol does not help  Extensive laboratory evaluation demonstrated normal or negative RPR, B12, CMP, HIV, folic acid, C. reactive protein, TSH, CBC, Lyme titer, ANA,  UPDATE Aug 04 2014: She continued to complain worsening symptoms since last visit in November 2014, she complains of frequent tearing, light sensitivity, frequent headaches, starting from her occipital region, she also complains of bilateral tinnitus, whooshing sound in her ear, almost constant,  She was recently evaluated [facility], Dr. 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 Karleen HampshireSpencer; recommend f/u in 1 week with neurology   She lives with her her 3 small children, with her friend,      REVIEW OF SYSTEMS: Full 14 system  review of systems performed and notable only for ringing in ears, loss of vision, hedache  ALLERGIES: No Known Allergies  HOME MEDICATIONS: Outpatient Prescriptions Prior  to Visit  Medication Sig Dispense Refill  . acetaminophen (TYLENOL) 500 MG tablet Take 500 mg by mouth every 6 (six) hours as needed. For pain.      . Prenatal Vit-Fe Fumarate-FA (PRENATAL MULTIVITAMIN) TABS Take 1 tablet by mouth at bedtime.      . promethazine (PHENERGAN) 12.5 MG tablet Take 1 tablet (12.5 mg total) by mouth every 6 (six) hours as needed for nausea.  30 tablet  0     PAST MEDICAL HISTORY: Past Medical History  Diagnosis Date  . HA (headache)   . Loss of vision   . Ringing in ears     PAST SURGICAL HISTORY: Past Surgical History  Procedure Laterality Date  . Cesarean section    . Open fracture right ankle  Sept 2012     pins  . Left eye      2013    FAMILY HISTORY: Family History  Problem Relation Age of Onset  . Diabetes Mother   . Anesthesia problems Neg Hx   . Hypotension Neg Hx   . Malignant hyperthermia Neg Hx   . Pseudochol deficiency Neg Hx   . Asthma Mother     SOCIAL HISTORY:  History   Social History  . Marital Status: Married    Spouse Name: N/A    Number of Children: 3  . Years of Education: 11   Occupational History  .      Does not work.   Social History Main Topics  . Smoking status: Never Smoker   . Smokeless tobacco: Never Used  . Alcohol Use: No  . Drug Use: No  . Sexual Activity: Not on file   Other Topics Concern  . Not on file   Social History Narrative   Patient lives at home with her friend. Patient does not work. 11 th grade education. Patient has three children.    Caffeine- None   Right handed     PHYSICAL EXAM  Filed Vitals:   08/04/14 0930  BP: 110/75  Pulse: 62  Height: 5\' 3"  (1.6 m)  Weight: 192 lb (87.091 kg)    Not recorded    Body mass index is 34.02 kg/(m^2).   Generalized: In no acute distress  Neck: Supple, no carotid bruits   Cardiac: Regular rate rhythm  Pulmonary: Clear to auscultation bilaterally  Musculoskeletal: No deformity  Neurological examination  Mentation:  Alert oriented to time, place, history taking, and causual conversation  Cranial nerve II-XII: Pupils were equal round reactive to light, extraocular movements were full,  bilateral disc atrophy.She could only read very large print left eye, moderate print with right eye.  She can read very large print with her left eye .   facial sensation and strength were normal. hearing was intact to finger rubbing bilaterally. Uvula tongue midline.  head turning and shoulder shrug and were normal and symmetric.Tongue protrusion into cheek strength was normal.  Motor: normal tone, bulk and strength.  Sensory: Intact to fine touch, pinprick, preserved vibratory sensation, and proprioception at toes.  Coordination: Normal finger to nose, heel-to-shin bilaterally there was no truncal ataxia  Gait: Rising up from seated position without assistance, normal stance, without trunk ataxia, moderate stride, good arm swing, smooth turning, able to perform tiptoe, and heel walking without difficulty.   Romberg signs: Negative  Deep tendon reflexes:  Brachioradialis 2/2, biceps 2/2, triceps 2/2, patellar 2/2, Achilles 2/2, plantar responses were flexor bilaterally.   DIAGNOSTIC DATA (LABS, IMAGING, TESTING) - I reviewed patient records, labs, notes, testing and imaging myself where available.  Lab Results  Component Value Date   WBC 5.4 06/23/2013   HGB 12.7 06/23/2013   HCT 37.4 06/23/2013   MCV 81 06/23/2013   PLT 214 06/23/2013      Component Value Date/Time   NA 139 06/23/2013 0908   NA 134* 02/02/2012 1707   K 4.1 06/23/2013 0908   CL 102 06/23/2013 0908   CO2 21 06/23/2013 0908   GLUCOSE 82 06/23/2013 0908   GLUCOSE 77 02/02/2012 1707   BUN 10 06/23/2013 0908   BUN 7 02/02/2012 1707   CREATININE 0.73 06/23/2013 0908   CALCIUM 9.2 06/23/2013 0908   PROT 7.4 06/23/2013 0908   PROT 7.0 02/02/2012 1707   ALBUMIN 3.4* 02/02/2012 1707   AST 17 06/23/2013 0908   ALT 9 06/23/2013 0908   ALKPHOS 61 06/23/2013 0908   BILITOT 0.2 06/23/2013  0908   GFRNONAA 114 06/23/2013 0908   GFRAA 131 06/23/2013 0908    ASSESSMENT AND PLAN   28 years old female, with idiopathic intracranial hypertension, since 2013, during her pregnancy, now with permanent visual loss, bilateral optic disc atrophy,  She continues to complain slow worsening of her visual acuity, she was able to see bugs in the past, now she could no longer see them, she also complains of worsening headaches, occipital region, light sensitivity, tearing, tinnitus, bilateral ear whoosing sounds.  This could indicate ongoing increased intracranial hypertension, we will repeat MRI of the brain Fluoroscopy guided lumbar puncture,  Increase Topamax to 100 mg twice a day, Maxalt as needed for moderate to severe headaches.    Levert Feinstein, M.D. Ph.D.  [facility] [address], [address] [place], [place] [address] [phone]

## 2014-08-17 ENCOUNTER — Encounter: Payer: Self-pay | Admitting: Neurology

## 2014-08-28 ENCOUNTER — Encounter (INDEPENDENT_AMBULATORY_CARE_PROVIDER_SITE_OTHER): Payer: Medicaid Other | Admitting: Diagnostic Neuroimaging

## 2014-08-28 ENCOUNTER — Ambulatory Visit
Admission: RE | Admit: 2014-08-28 | Discharge: 2014-08-28 | Disposition: A | Discharge status: Home / Self Care | Payer: Medicaid Other | Source: Ambulatory Visit | Attending: Neurology | Admitting: Neurology

## 2014-08-28 DIAGNOSIS — G932 Benign intracranial hypertension: Secondary | ICD-10-CM

## 2014-08-28 DIAGNOSIS — R51 Headache: Principal | ICD-10-CM

## 2014-08-28 DIAGNOSIS — H547 Unspecified visual loss: Secondary | ICD-10-CM

## 2014-08-28 DIAGNOSIS — H9313 Tinnitus, bilateral: Secondary | ICD-10-CM

## 2014-08-28 DIAGNOSIS — R519 Headache, unspecified: Secondary | ICD-10-CM

## 2014-09-04 ENCOUNTER — Ambulatory Visit (INDEPENDENT_AMBULATORY_CARE_PROVIDER_SITE_OTHER): Payer: Medicaid Other | Admitting: Neurology

## 2014-09-04 ENCOUNTER — Telehealth: Payer: Self-pay

## 2014-09-04 ENCOUNTER — Encounter: Payer: Self-pay | Admitting: Neurology

## 2014-09-04 VITALS — BP 101/68 | HR 70 | Ht 63.0 in | Wt 204.0 lb

## 2014-09-04 DIAGNOSIS — H547 Unspecified visual loss: Secondary | ICD-10-CM

## 2014-09-04 DIAGNOSIS — R51 Headache: Secondary | ICD-10-CM

## 2014-09-04 DIAGNOSIS — H9313 Tinnitus, bilateral: Secondary | ICD-10-CM

## 2014-09-04 DIAGNOSIS — R519 Headache, unspecified: Secondary | ICD-10-CM

## 2014-09-04 DIAGNOSIS — G932 Benign intracranial hypertension: Secondary | ICD-10-CM

## 2014-09-04 MED ORDER — NORTRIPTYLINE HCL 25 MG PO CAPS: ORAL_CAPSULE | ORAL | Status: DC

## 2014-09-04 MED ORDER — TOPIRAMATE 100 MG PO TABS: 100.0000 mg | ORAL_TABLET | Freq: Two times a day (BID) | ORAL | Status: DC

## 2014-09-04 NOTE — Telephone Encounter (Signed)
Patient was here today seeing Dr.Yan Patient did not want to schedule Lumbar Puncture today because she has to get help with her children. Patient was giving Duwayne HeckDanielle number to call when she is ready to schedule order placed.

## 2014-09-04 NOTE — Progress Notes (Signed)
[facility]  PATIENT: Valerie DeemCarmen Wong DOB: Aug 24, 1986  HISTORICAL Valerie MylarCarmen is a 28 years old right-handed female, referred by her primary care physician for evaluation of bilateral visual loss  She was previously healthy, G2P3, during her third pregnancy in 2013, she developed constant headaches, tinnitus, low back pain, progressive vision loss, left more than right eye since March 2013, this was during her second trimester, she was evaluated by ophthalmologist Dr. Hollice EspyStephen Newman, Prof. 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 Stenosis of bilateral sigmoid sinus, at the junction with transverse sinus, no evidence of acute hemorrhage or mass lesion, CTV of head showed no evidence of thrombosis, hypoplastic right transverse sinus, severe stenosis at the junction of the right transverse and sigmoid sinus, severe stenosis at the junction of left transverse and sigmoid sinus,  She underwent LP, with OP 35.5 she was started on Diamox 250 mg 2 tablets 4 times a day, but with continued vision loss, she had left optic nerve fenetration in May 2014, with transient improvement in her left vision, followup visual filed testing also demonstrated dramatic improvement,  She moved from [place] to [place] in Spring 2014, she now began to experience worsening visual loss again, she could only see very large print through  her left eye, very blurred right eye vision, continued to have frequent right frontal parietal headaches, tinnitus,  She was evaluated by ophthalmologist Dr. Aura CampsMichael Spencer in May 26 2013, visual acuity right eye 20/200, left eye 20/300, there was no longer papillary edema,, but has 3 plus optic atrophy bilaterally  UPDATE 09/01/2013:  She is now evaluated by opthamologist Dr. Lita MainsHaines, at [facility], will be seen again in Feb 2015. She continues to have very poor vision, right side is slightly better than the left side, she is the main caregiver of her 3 children at age 367, 343, and 1.  She is in the process of applying for disability, I filled in the paperwork for her, she complains of frequent right parietal area headaches, sharp transient pain, multiple times a day, Tylenol does not help  Extensive laboratory evaluation demonstrated normal or negative RPR, B12, CMP, HIV, folic acid, C. reactive protein, TSH, CBC, Lyme titer, ANA,  UPDATE Aug 04 2014: She continued to complain worsening symptoms since last visit in November 2014, she complains of frequent tearing, light sensitivity, frequent headaches, starting from her occipital region, she also complains of bilateral tinnitus, whooshing sound in her ear, almost constant,  She was recently evaluated [facility], Dr. 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 Karleen HampshireSpencer; recommend f/u in 1 week with neurology   She lives with her her 3 small children, with her friend,   UPDATE Nov 20th 2015; She is now taking Topamax  100 mg twice a day, Maxalt as needed for headaches, she reported mild improvement, instead of daily headaches, she has about couple times headache each week, it is at  left occipital region, pounding headaches, Maxalt was helpful, she is  fixing her medication herself,  I am not sure, that with her poor vision, she is totally compliance with her medications,  Repeat MRI of the brain was normal, she did not have a lumbar puncture as previously scheduled      REVIEW OF SYSTEMS: Full 14 system review of systems performed and notable only for ringing in ears, loss of vision, hedache  ALLERGIES: No Known Allergies  HOME MEDICATIONS: Outpatient Prescriptions Prior to Visit  Medication Sig Dispense Refill  . acetaminophen (TYLENOL) 500 MG tablet Take 500 mg by mouth every 6 (six) hours as needed. For pain.      . Prenatal Vit-Fe Fumarate-FA (PRENATAL MULTIVITAMIN) TABS Take 1 tablet by mouth at bedtime.      . promethazine (PHENERGAN) 12.5 MG tablet Take 1 tablet (12.5 mg total) by mouth every 6 (six) hours as needed for nausea.  30 tablet  0     PAST MEDICAL HISTORY: Past Medical History  Diagnosis Date  . HA (headache)   . Loss of vision   . Ringing in ears     PAST SURGICAL HISTORY: Past Surgical History  Procedure Laterality Date  . Cesarean section    . Open fracture right ankle  Sept 2012     pins  . Left eye      2013    FAMILY HISTORY: Family History  Problem Relation Age of Onset  . Diabetes Mother   . Anesthesia problems Neg Hx   . Hypotension Neg Hx   . Malignant hyperthermia Neg Hx   . Pseudochol deficiency Neg Hx   . Asthma Mother     SOCIAL HISTORY:  History   Social History  . Marital Status: Married    Spouse Name: N/A    Number of Children: 3  . Years of Education: 11   Occupational History  .      Does not work.   Social History Main Topics  . Smoking status: Never Smoker   . Smokeless tobacco: Never Used  . Alcohol Use: No  . Drug Use: No  . Sexual Activity: Not on file   Other Topics Concern  . Not on file   Social History Narrative   Patient lives at home with her friend. Patient does not work.  11 th grade education. Patient has three children.    Caffeine- None   Right handed     PHYSICAL EXAM  There were no vitals filed for this visit.  Not recorded      There is no weight on file to calculate BMI.   Generalized: In no acute distress  Neck: Supple, no carotid bruits   Cardiac: Regular rate rhythm  Pulmonary: Clear to auscultation bilaterally  Musculoskeletal: No deformity  Neurological examination  Mentation: Alert oriented to time, place, history taking, and causual conversation  Cranial nerve II-XII: Pupils were equal round reactive to light, extraocular movements were full,  bilateral disc atrophy.She could only read very large print left eye, moderate print with right eye.  She can read very large print with her left eye .   facial sensation and strength were normal. hearing was intact to finger rubbing bilaterally. Uvula tongue midline.  head turning and shoulder shrug and were normal and symmetric.Tongue protrusion into  cheek strength was normal.  Motor: normal tone, bulk and strength.  Sensory: Intact to fine touch, pinprick, preserved vibratory sensation, and proprioception at toes.  Coordination: Normal finger to nose, heel-to-shin bilaterally there was no truncal ataxia  Gait: Rising up from seated position without assistance, normal stance, without trunk ataxia, moderate stride, good arm swing, smooth turning, able to perform tiptoe, and heel walking without difficulty.   Romberg signs: Negative  Deep tendon reflexes: Brachioradialis 2/2, biceps 2/2, triceps 2/2, patellar 2/2, Achilles 2/2, plantar responses were flexor bilaterally.   DIAGNOSTIC DATA (LABS, IMAGING, TESTING) - I reviewed patient records, labs, notes, testing and imaging myself where available.  Lab Results  Component Value Date   WBC 5.4 06/23/2013   HGB 12.7 06/23/2013   HCT 37.4 06/23/2013   MCV 81 06/23/2013   PLT 214 06/23/2013      Component Value Date/Time   NA  139 06/23/2013 0908   NA 134* 02/02/2012 1707   K 4.1 06/23/2013 0908   CL 102 06/23/2013 0908   CO2 21 06/23/2013 0908   GLUCOSE 82 06/23/2013 0908   GLUCOSE 77 02/02/2012 1707   BUN 10 06/23/2013 0908   BUN 7 02/02/2012 1707   CREATININE 0.73 06/23/2013 0908   CALCIUM 9.2 06/23/2013 0908   PROT 7.4 06/23/2013 0908   PROT 7.0 02/02/2012 1707   ALBUMIN 3.4* 02/02/2012 1707   AST 17 06/23/2013 0908   ALT 9 06/23/2013 0908   ALKPHOS 61 06/23/2013 0908   BILITOT 0.2 06/23/2013 0908   GFRNONAA 114 06/23/2013 0908   GFRAA 131 06/23/2013 0908    ASSESSMENT AND PLAN   28 years old female, with idiopathic intracranial hypertension, since 2013, during her pregnancy, now with permanent visual loss, bilateral optic disc atrophy,  She continues to complain slow worsening of her visual acuity, she was able to see bugs in the past, now she could no longer see them, she also complains of worsening headaches, occipital region, light sensitivity, tearing, tinnitus, bilateral ear whoosing sounds.  This could indicate ongoing increased intracranial hypertension,  repeat MRI of the brain was normal. Fluoroscopy guided lumbar puncture,  Keep Topamax to 100 mg twice a day, add on Nortriptyline titrating to 50mg  qhs. Maxalt as needed for moderate to severe headaches. RTC in one month  Levert FeinsteinYijun Mischele Detter, M.D. Ph.D.  [facility] [address], [address] [place], [place] [id number] [phone]

## 2014-09-09 ENCOUNTER — Ambulatory Visit: Payer: Medicaid Other | Admitting: Nurse Practitioner

## 2014-10-22 ENCOUNTER — Ambulatory Visit: Payer: Medicaid Other | Admitting: Neurology

## 2014-11-26 ENCOUNTER — Ambulatory Visit: Payer: Medicaid Other | Admitting: Neurology

## 2017-01-24 ENCOUNTER — Encounter: Payer: Self-pay | Admitting: Neurology

## 2017-01-24 ENCOUNTER — Ambulatory Visit (INDEPENDENT_AMBULATORY_CARE_PROVIDER_SITE_OTHER): Payer: Self-pay | Admitting: Neurology

## 2017-01-24 ENCOUNTER — Encounter (INDEPENDENT_AMBULATORY_CARE_PROVIDER_SITE_OTHER): Payer: Self-pay

## 2017-01-24 VITALS — Ht 63.0 in | Wt 231.0 lb

## 2017-01-24 DIAGNOSIS — H547 Unspecified visual loss: Secondary | ICD-10-CM

## 2017-01-24 DIAGNOSIS — G932 Benign intracranial hypertension: Secondary | ICD-10-CM

## 2017-01-24 DIAGNOSIS — H9313 Tinnitus, bilateral: Secondary | ICD-10-CM

## 2017-01-24 DIAGNOSIS — G8929 Other chronic pain: Secondary | ICD-10-CM

## 2017-01-24 DIAGNOSIS — R51 Headache: Secondary | ICD-10-CM

## 2017-01-24 MED ORDER — SUMATRIPTAN SUCCINATE 100 MG PO TABS: 100.0000 mg | ORAL_TABLET | ORAL | 6 refills | Status: AC | PRN

## 2017-01-24 MED ORDER — TOPIRAMATE 100 MG PO TABS: 100.0000 mg | ORAL_TABLET | Freq: Two times a day (BID) | ORAL | 11 refills | Status: AC

## 2017-01-24 NOTE — Progress Notes (Signed)
PATIENT: Sunjai Levandoski DOB: [birth year]  Chief Complaint  Patient presents with  . Pseudotumor Cerebri    She was lost to follow up here because she lost her Medicaid.  She was last seen 09/04/14.  She has been having increased headaches and worsening of vision.  She estimates 4-5 headache days per week.  She is using Tylenol as needed.     HISTORICAL  Tanaja Ganger is a 31 years old right-handed female, follow-up for pseudotumor cerebri. Last clinical visit with our clinic was in November 2015.  I saw her initially in September 2014,  referred by her primary care physician for evaluation of bilateral visual loss  She was previously healthy, G2P3, during her third pregnancy in 2013, she developed constant headaches, tinnitus, low back pain, progressive vision loss, left more than right eye since March 2013, this was during her second trimester, she was evaluated by ophthalmologist Dr. Hollice Espy, Prof. 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 Stenosis of bilateral sigmoid sinus, at the junction with transverse sinus, no evidence of acute hemorrhage or mass lesion, CTV of head showed no evidence of thrombosis, hypoplastic right transverse sinus, severe stenosis at the junction of the right transverse and sigmoid sinus, severe stenosis at the junction of left transverse and sigmoid sinus,  She underwent LP, with  OP 35.5cm,  she was started on Diamox 250 mg 2 tablets 4 times a day, but she continued to have vision loss, she had left optic nerve fenetration in May 2014, with transient improvement in her left vision, followup visual filed testing also demonstrated dramatic improvement,  She moved from [place] to [place] in Spring 2014, she now began to experience worsening visual loss again, she could only see very large print through her left eye, very blurred right eye vision, continued to have frequent right frontal parietal headaches, tinnitus,  She was evaluated by ophthalmologist Dr. Aura Camps in May 26 2013, visual acuity right eye 20/200, left eye 20/300, there was no longer papillary edema,, but has 3 plus optic atrophy bilaterally  She was evaluated by opthamologist Dr. Lita Mains at [facility] in Nov 2014,  for recurrent headaches, but again there was no longer disc edema, evidence of bilateral disc atrophy, visual loss will be permanent. HVF 30-2, OD reliable, severe restriction with residual paracentral field supranasally, OS, reliable, severe restriction in all quadrants,  she is the main caregiver of her 3 children at age 55, 51, and 1.  She was in the process of applying for disability, I filled in the paperwork for her 2015 , she complains of frequent right parietal area headaches, sharp transient pain, multiple times a day, Tylenol does not help  Extensive laboratory evaluation demonstrated normal or negative RPR, B12, CMP, HIV, folic acid, C. reactive protein, TSH, CBC, Lyme titer, ANA,  During her last office visit in October 2015, and November 2015, she complains of frequent headaches, she was started on Topamax 100 mg twice a day, Maxalt as  needed for headache, she reported mild improvement, about 2 headaches each week, at left occipital region, pounding,   I personally reviewed MRI of the brain wo was normal in Nov 2015, she did not have a lumbar puncture as previously  scheduled   She lost follow-ups during her last visit in November 2015, her disability was not approved, she complains of worsening left vision,  she works as a Child psychotherapist, tearing easily, her children's now are all 11, 6, 4, her husband lives with them. She is not driving   She still has frequent headaches, on a daily basis, mostly at the right occipital region, severe pounding, lasting for a few hours, then return again the same day, she is now taking Tylenol, beeper of an, over-the-counter medication does not help her.  She also complains of 2 black points, the size of a fly, following her vision on the left eye. She still has intermittent ringing sounds in her ear,    REVIEW OF SYSTEMS: Full 14 system review of systems performed and notable only for loss of vision, headaches,  ALLERGIES: No Known Allergies  HOME MEDICATIONS: Current Outpatient Prescriptions  Medication Sig Dispense Refill  . Acetaminophen (TYLENOL PO) Take by mouth as needed.     No current facility-administered medications for this visit.     PAST MEDICAL HISTORY: Past Medical History:  Diagnosis Date  . HA (headache)   . Loss of vision   . Pseudotumor cerebri   . Ringing in ears     PAST SURGICAL HISTORY: Past Surgical History:  Procedure Laterality Date  . CESAREAN SECTION    . Left eye     2013  . open fracture right ankle  Sept 2012    pins    FAMILY HISTORY: Family History  Problem Relation Age of Onset  . Diabetes Mother   . Asthma Mother   . Healthy Father   . Anesthesia problems Neg Hx   . Hypotension Neg Hx   . Malignant hyperthermia Neg Hx   . Pseudochol deficiency Neg Hx     SOCIAL HISTORY:  Social History   Social History  . Marital status: Married    Spouse name: N/A  . Number of children: 3  . Years of education: 84   Occupational History  . Waitress    Social History Main Topics  . Smoking status: Never Smoker  . Smokeless tobacco: Never Used  . Alcohol use No  .  Drug use: No  . Sexual activity: Not on file   Other Topics Concern  . Not on file   Social History Narrative   Patient lives at home with her husband and children.   No caffeine use.   Right handed     PHYSICAL EXAM   Vitals:   01/24/17 0836  Weight: 231 lb (104.8 kg)  Height:  (1.6 m)    Not recorded      Body mass index is 40.92 kg/m.  PHYSICAL EXAMNIATION:  Gen: NAD, conversant, well nourised, obese, well groomed                     Cardiovascular: Regular rate rhythm, no peripheral edema, warm, nontender. Eyes: Conjunctivae clear without exudates or hemorrhage Neck: Supple, no carotid bruits. Pulmonary: Clear to auscultation bilaterally   NEUROLOGICAL EXAM:  MENTAL STATUS: Speech:    Speech is normal; fluent and spontaneous with normal comprehension.  Cognition:     Orientation to time, place and person  Normal recent and remote memory     Normal Attention span and concentration     Normal Language, naming, repeating,spontaneous speech     Fund of knowledge   CRANIAL NERVES: CN II: OD 20/200, OS 20/400, bilateral disc atrophy, no longer has edema,  CN III, IV, VI: extraocular movement are normal. No ptosis. CN V: Facial sensation is intact to pinprick in all 3 divisions bilaterally. Corneal responses are intact.  CN VII: Face is symmetric with normal eye closure and smile. CN VIII: Hearing is normal to rubbing fingers CN IX, X: Palate elevates symmetrically. Phonation is normal. CN XI: Head turning and shoulder shrug are intact CN XII: Tongue is midline with normal movements and no atrophy.  MOTOR: There is no pronator drift of out-stretched arms. Muscle bulk and tone are normal. Muscle strength is normal.  REFLEXES: Reflexes are 2+ and symmetric at the biceps, triceps, knees, and ankles. Plantar responses are flexor.  SENSORY: Intact to light touch, pinprick, positional sensation and vibratory sensation are intact in fingers and  toes.  COORDINATION: Rapid alternating movements and fine finger movements are intact. There is no dysmetria on finger-to-nose and heel-knee-shin.    GAIT/STANCE: Posture is normal. Gait is steady with normal steps, base, arm swing, and turning. Heel and toe walking are normal. Tandem gait is normal.  Romberg is absent.   DIAGNOSTIC DATA (LABS, IMAGING, TESTING) - I reviewed patient records, labs, notes, testing and imaging myself where available.   ASSESSMENT AND PLAN  Josiah Wojtaszek is a 31 y.o. 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 Ph.D.  [facility] [address], [address] [place], [place] [address] Ph: [phone] Fax: [phone]  CC: Referring Provider

## 2017-07-24 NOTE — Progress Notes (Deleted)
[facility]  PATIENT: Valerie Wong DOB: 07-16-1986   REASON FOR VISIT: *** HISTORY FROM:    HISTORY OF PRESENT ILLNESS:Valerie Wong is a 31 years old right-handed female, follow-up for pseudotumor cerebri. Last clinical visit with our clinic was in November 2015.  I saw her initially in September 2014,  referred by her primary care physician for evaluation of bilateral visual loss  She was previously healthy, G2P3, during her third pregnancy in 2013, she developed constant headaches, tinnitus, low back pain, progressive vision loss, left more than right eye since March 2013, this was during her second trimester, she was evaluated by ophthalmologist Dr. Hollice Espy, Prof. 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 Stenosis of bilateral sigmoid sinus, at the junction with transverse sinus, no evidence of acute hemorrhage or mass lesion, CTV of head showed no evidence of thrombosis, hypoplastic right transverse sinus, severe stenosis at the junction of the right transverse and sigmoid sinus, severe stenosis at the junction of left transverse and sigmoid sinus,  She underwent LP, with OP 35.5cm,  she was started on Diamox 250 mg 2 tablets 4 times a day, but she continued to have vision loss, she had left optic nerve fenetration in May 2014, with transient improvement in her left vision, followup  visual filed testing also demonstrated dramatic improvement,  She moved from [place] to [place] in Spring 2014, she now began to experience worsening visual loss again, she could only see very large print through her left eye, very blurred right eye vision, continued to have frequent right frontal parietal headaches, tinnitus,  She was evaluated by ophthalmologist Dr. Aura Camps in May 26 2013, visual acuity right eye 20/200, left eye 20/300, there was no longer papillary edema,, but has 3 plus optic atrophy bilaterally  She was evaluated by opthamologist [facility] at [facility] in Nov 2014,  for recurrent headaches, but again there was no longer disc edema, evidence of bilateral disc atrophy, visual loss will be permanent. HVF 30-2, OD reliable, severe restriction with residual paracentral field supranasally, OS, reliable, severe restriction in all quadrants,  she is the main caregiver of her 3 children at age 72, 22, and 1.  She was in the process of applying for disability, I filled in the paperwork for her 2015 , she complains of frequent right parietal area headaches, sharp transient pain, multiple times a day, Tylenol does not help  Extensive laboratory evaluation demonstrated normal or negative RPR, B12, CMP, HIV, folic acid, C. reactive protein, TSH, CBC, Lyme titer, ANA,  During her last office visit in October 2015, and November 2015, she complains of frequent headaches, she was started on Topamax 100 mg twice a day, Maxalt as needed for headache, she reported mild improvement, about 2 headaches each week, at left occipital region, pounding,   I personally reviewed MRI of the brain wo was normal in Nov 2015, she did not have a lumbar puncture as previously scheduled   She lost follow-ups during her last  visit in November 2015, her disability was not approved, she complains of worsening left vision,  she works as a Child psychotherapist, tearing easily, 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 She is not driving   She still has frequent headaches, on a daily basis, mostly at the right occipital region, severe pounding, lasting for a few hours, then return again the same day, she is now taking Tylenol, beeper of an, over-the-counter medication does not help her.  She also complains of 2 black points, the size of a fly, following her vision on the left eye. She still has intermittent ringing sounds in her ear,     REVIEW OF SYSTEMS: Full 14 system review of systems performed and notable only for those listed, all others are neg:  Constitutional: neg  Cardiovascular: neg Ear/Nose/Throat: neg  Skin: neg Eyes: neg Respiratory: neg Gastroitestinal: neg  Hematology/Lymphatic: neg  Endocrine: neg Musculoskeletal:neg Allergy/Immunology: neg Neurological: neg Psychiatric: neg Sleep : neg   ALLERGIES: No Known Allergies  HOME MEDICATIONS: Outpatient Medications Prior to Visit  Medication Sig Dispense Refill  . Acetaminophen (TYLENOL PO) Take by mouth as needed.    . SUMAtriptan (IMITREX) 100 MG tablet Take 1 tablet (100 mg total) by mouth every 2 (two) hours as needed for migraine. May repeat in 2 hours if headache persists or recurs. 15 tablet 6  . topiramate (TOPAMAX) 100 MG tablet Take 1 tablet (100 mg total) by mouth 2 (two) times daily. 60 tablet 11   No facility-administered medications prior to visit.     PAST MEDICAL HISTORY: Past Medical History:  Diagnosis Date  . HA (headache)   . Loss of vision   . Pseudotumor cerebri   . Ringing in ears     PAST SURGICAL HISTORY: Past Surgical History:  Procedure Laterality Date  . CESAREAN SECTION    . Left eye     2013  . open fracture right ankle  Sept 2012    pins    FAMILY HISTORY: Family History  Problem Relation Age of Onset  . Diabetes Mother   . Asthma Mother   . Healthy Father   . Anesthesia problems Neg Hx   . Hypotension Neg Hx   . Malignant  hyperthermia Neg Hx   . Pseudochol deficiency Neg Hx     SOCIAL HISTORY: Social History   Social History  . Marital status: Married    Spouse name: N/A  . Number of children: 3  . Years of education: 47   Occupational History  . Waitress    Social History Main Topics  . Smoking status: Never Smoker  . Smokeless tobacco: Never Used  . Alcohol use No  . Drug use: No  . Sexual activity: Not on file   Other Topics Concern  . Not on file   Social History Narrative   Patient lives at home with her husband and children.   No caffeine use.   Right handed     PHYSICAL EXAM  There were no vitals filed for this visit. There is no height or weight on file to calculate BMI.  Generalized: Well developed, in no acute distress  Head: normocephalic and atraumatic,. Oropharynx benign  Neck: Supple, no carotid bruits  Cardiac: Regular rate rhythm, no murmur  Musculoskeletal: No deformity   Neurological examination   Mentation: Alert oriented to time, place, history taking. Attention span and concentration appropriate. Recent and remote memory intact.  Follows all commands speech and  language fluent.   Cranial nerve II-XII: Fundoscopic exam reveals sharp disc margins.Pupils were equal round reactive to light extraocular movements were full, visual field were full on confrontational test. Facial sensation and strength were normal. hearing was intact to finger rubbing bilaterally. Uvula tongue midline. head turning and shoulder shrug were normal and symmetric.Tongue protrusion into cheek strength was normal. Motor: normal bulk and tone, full strength in the BUE, BLE, fine finger movements normal, no pronator drift. No focal weakness Sensory: normal and symmetric to light touch, pinprick, and  Vibration, proprioception  Coordination: finger-nose-finger, heel-to-shin bilaterally, no dysmetria Reflexes: Brachioradialis 2/2, biceps 2/2, triceps 2/2, patellar 2/2, Achilles 2/2, plantar  responses were flexor bilaterally. Gait and Station: Rising up from seated position without assistance, normal stance,  moderate stride, good arm swing, smooth turning, able to perform tiptoe, and heel walking without difficulty. Tandem gait is steady  DIAGNOSTIC DATA (LABS, IMAGING, TESTING) - I reviewed patient records, labs, notes, testing and imaging myself where available.  Lab Results  Component Value Date   WBC 5.4 06/23/2013   HGB 12.7 06/23/2013   HCT 37.4 06/23/2013   MCV 81 06/23/2013   PLT 214 06/23/2013      Component Value Date/Time   NA 139 06/23/2013 0908   K 4.1 06/23/2013 0908   CL 102 06/23/2013 0908   CO2 21 06/23/2013 0908   GLUCOSE 82 06/23/2013 0908   GLUCOSE 77 02/02/2012 1707   BUN 10 06/23/2013 0908   CREATININE 0.73 06/23/2013 0908   CALCIUM 9.2 06/23/2013 0908   PROT 7.4 06/23/2013 0908   ALBUMIN 4.6 06/23/2013 0908   AST 17 06/23/2013 0908   ALT 9 06/23/2013 0908   ALKPHOS 61 06/23/2013 0908   BILITOT 0.2 06/23/2013 0908   GFRNONAA 114 06/23/2013 0908   GFRAA 131 06/23/2013 0908   No results found for: CHOL, HDL, LDLCALC, LDLDIRECT, TRIG, CHOLHDL No results found for: ZOXW9U Lab Results  Component Value Date   VITAMINB12 480 06/23/2013   Lab Results  Component Value Date   TSH 1.950 06/23/2013    ***  ASSESSMENT AND PLAN Zilah Villaflor is a 31 y.o. 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

## 2017-07-25 ENCOUNTER — Ambulatory Visit: Payer: Self-pay | Admitting: Nurse Practitioner

## 2017-07-26 ENCOUNTER — Encounter: Payer: Self-pay | Admitting: Nurse Practitioner

## 2020-03-14 ENCOUNTER — Emergency Department (HOSPITAL_COMMUNITY)
Admission: EM | Admit: 2020-03-14 | Discharge: 2020-03-14 | Disposition: A | Discharge status: Home / Self Care | Payer: Self-pay | Attending: Emergency Medicine | Admitting: Emergency Medicine

## 2020-03-14 ENCOUNTER — Emergency Department (HOSPITAL_COMMUNITY): Payer: Self-pay

## 2020-03-14 ENCOUNTER — Other Ambulatory Visit: Payer: Self-pay

## 2020-03-14 ENCOUNTER — Encounter (HOSPITAL_COMMUNITY): Payer: Self-pay | Admitting: Emergency Medicine

## 2020-03-14 DIAGNOSIS — M545 Low back pain, unspecified: Secondary | ICD-10-CM

## 2020-03-14 DIAGNOSIS — Z79899 Other long term (current) drug therapy: Secondary | ICD-10-CM | POA: Insufficient documentation

## 2020-03-14 LAB — POC URINE PREG, ED: Preg Test, Ur: NEGATIVE

## 2020-03-14 MED ORDER — HYDROMORPHONE HCL 1 MG/ML IJ SOLN
1.0000 mg | Freq: Once | INTRAMUSCULAR | Status: AC
  Administered 2020-03-14: 1 mg via INTRAMUSCULAR
  Filled 2020-03-14: qty 1

## 2020-03-14 MED ORDER — METHOCARBAMOL 750 MG PO TABS
750.0000 mg | ORAL_TABLET | Freq: Three times a day (TID) | ORAL | 0 refills | Status: AC | PRN
Start: 2020-03-14 — End: ?

## 2020-03-14 NOTE — Discharge Instructions (Addendum)
It was our pleasure to provide your ER care today - we hope that you feel better.  Take ibuprofen or acetaminophen as need for pain. You may also take robaxin as need for muscle pain/spasm - no driving when taking.  Avoid bending at waist, or heavy lifting > 20 lbs for the next week.   You may also try gentle massage to area, and/or heat therapy for symptom relief.   Follow up with primary care doctor in the next 1-2 weeks.   Return to ER if worse, new symptoms, fevers, worsening or intractable pain, numbness/weakness, or other concern.   You were given pain medication in the ER - no driving for the next 8 hours.

## 2020-03-14 NOTE — ED Triage Notes (Signed)
C/o severe lower back pain x 3 days.  Pain worse on Left side.  Denies injury or radiation.  Denies urinary complaints.  Pt crying and pacing at triage.

## 2020-03-14 NOTE — ED Provider Notes (Signed)
[facility] EMERGENCY DEPARTMENT Provider Note   CSN: [id number] Arrival date & time: 03/14/20  1209     History Chief Complaint  Patient presents with  . Back Pain    Valerie Wong is a 34 y.o. female.  Patient c/o low back pain for past 3 days. Symptoms acute onset, mod-severe, constant, dull, non radiating. Worse w certain movements, positional changes. Denies hx same or similar back pain in past. Denies recent back injury or strain. No hx ddd. No saddle area or leg numbness. No weakness. No urinary retention, or incontinence. No dysuria or hematuria. No hx kidney stones. No fever or chills. States other than the back pain does not feel sick or ill.   The history is provided by the patient.  Back Pain Associated symptoms: no abdominal pain, no chest pain, no dysuria, no fever, no headaches, no numbness, no pelvic pain and no weakness        Past Medical History:  Diagnosis Date  . HA (headache)   . Loss of vision   . Pseudotumor cerebri   . Ringing in ears     Patient Active Problem List   Diagnosis Date Noted  . Pseudotumor cerebri 09/01/2013  . HA (headache)   . Loss of vision   . Ringing in ears     Past Surgical History:  Procedure Laterality Date  . CESAREAN SECTION    . Left eye     2013  . open fracture right ankle  Sept 2012    pins     OB History    Gravida  5   Para  2   Term  2   Preterm      AB  2   Living  2     SAB  2   TAB      Ectopic  0   Multiple      Live Births              Family History  Problem Relation Age of Onset  . Diabetes Mother   . Asthma Mother   . Healthy Father   . Anesthesia problems Neg Hx   . Hypotension Neg Hx   . Malignant hyperthermia Neg Hx   . Pseudochol deficiency Neg Hx     Social History   Tobacco Use  . Smoking status: Never Smoker  . Smokeless tobacco: Never Used  Substance Use Topics  . Alcohol use: No    Alcohol/week: 0.0 standard drinks  . Drug  use: No    Home Medications Prior to Admission medications   Medication Sig Start Date End Date Taking? Authorizing Provider  Acetaminophen (TYLENOL PO) Take by mouth as needed.    [provider]  SUMAtriptan (IMITREX) 100 MG tablet Take 1 tablet (100 mg total) by mouth every 2 (two) hours as needed for migraine. May repeat in 2 hours if headache persists or recurs. 01/24/17   Levert Feinstein, MD  topiramate (TOPAMAX) 100 MG tablet Take 1 tablet (100 mg total) by mouth 2 (two) times daily. 01/24/17   Levert Feinstein, MD    Allergies    Patient has no known allergies.  Review of Systems   Review of Systems  Constitutional: Negative for chills and fever.  HENT: Negative for sore throat.   Eyes: Negative for redness.  Respiratory: Negative for cough and shortness of breath.   Cardiovascular: Negative for chest pain.  Gastrointestinal: Negative for abdominal pain and vomiting.  Genitourinary: Negative  for dysuria, flank pain, hematuria, pelvic pain, vaginal bleeding and vaginal discharge.  Musculoskeletal: Positive for back pain. Negative for neck pain.  Skin: Negative for rash.  Neurological: Negative for weakness, numbness and headaches.  Hematological: Does not bruise/bleed easily.  Psychiatric/Behavioral: Negative for confusion.    Physical Exam Updated Vital Signs BP 123/76 (BP Location: Right Arm)   Pulse 64   Temp 98.3 F (36.8 C) (Oral)   Resp 18   LMP 02/23/2020   SpO2 98%   Physical Exam Vitals and nursing note reviewed.  Constitutional:      Appearance: Normal appearance. She is well-developed.  HENT:     Head: Atraumatic.     Nose: Nose normal.     Mouth/Throat:     Mouth: Mucous membranes are moist.  Eyes:     General: No scleral icterus.    Conjunctiva/sclera: Conjunctivae normal.  Neck:     Trachea: No tracheal deviation.  Cardiovascular:     Rate and Rhythm: Normal rate and regular rhythm.     Pulses: Normal pulses.     Heart sounds: Normal heart  sounds. No murmur. No friction rub. No gallop.   Pulmonary:     Effort: Pulmonary effort is normal. No respiratory distress.     Breath sounds: Normal breath sounds.  Abdominal:     General: Bowel sounds are normal. There is no distension.     Palpations: Abdomen is soft. There is no mass.     Tenderness: There is no abdominal tenderness. There is no guarding or rebound.     Hernia: No hernia is present.  Genitourinary:    Comments: No cva tenderness.  Musculoskeletal:        General: No swelling.     Cervical back: Normal range of motion and neck supple. No rigidity. No muscular tenderness.     Comments: T/L/S spine non tender, aligned, no step off. No sts, skin changes/lesions, or erythema in area of pain.   Skin:    General: Skin is warm and dry.     Findings: No rash.  Neurological:     Mental Status: She is alert.     Comments: Alert, speech normal.  Motor intact bil lower ext, stre 5/5. Sens intact bil. Steady gait.   Psychiatric:     Comments: Anxious appearing.      ED Results / Procedures / Treatments   Labs (all labs ordered are listed, but only abnormal results are displayed) Results for orders placed or performed during the hospital encounter of 03/14/20  POC Urine Pregnancy, ED (not at [facility])  Result Value Ref Range   Preg Test, Ur NEGATIVE NEGATIVE   DG Lumbar Spine Complete  Result Date: 03/14/2020 CLINICAL DATA:  Severe lower back pain for 3 days. Pain worse in the left side. EXAM: LUMBAR SPINE - COMPLETE 4+ VIEW COMPARISON:  None. FINDINGS: There is no evidence of lumbar spine fracture. Alignment is normal. Intervertebral disc spaces are maintained. No focal bone lesion. Nonobstructive bowel gas pattern. IMPRESSION: Negative lumbar spine radiographs. Electronically Signed   By: Emmaline Kluver M.D.   On: 03/14/2020 14:11    EKG None  Radiology No results found.  Procedures Procedures (including critical care time)  Medications Ordered in  ED Medications  HYDROmorphone (DILAUDID) injection 1 mg (has no administration in time range)    ED Course  I have reviewed the triage vital signs and the nursing notes.  Pertinent labs & imaging results that were available during my care  of the patient were reviewed by me and considered in my medical decision making (see chart for details).    MDM Rules/Calculators/A&P                      Dilaudid 1 mg im. Labs sent.   Reviewed nursing notes and prior charts for additional history.   Labs reviewed/interpreted by me - u preg neg.   Xrays obtained.   Xrays reviewed/interpreted by me - no fx.   Recheck pt, comfortable, pain improved/resolved.  Pt appears stable for d/c (pt has ride, does not have to drive).  Rx  Robaxin. rec pcp f/u.  Return precautions provided.      Final Clinical Impression(s) / ED Diagnoses Final diagnoses:  None    Rx / DC Orders ED Discharge Orders    None       Lajean Saver, MD 03/14/20 [phone]

## 2020-03-14 NOTE — ED Notes (Signed)
Patient verbalizes understanding of discharge instructions. Opportunity for questioning and answers were provided. Armband removed by staff, pt discharged from ED ambulatory.   

## 2022-01-15 IMAGING — DX DG LUMBAR SPINE COMPLETE 4+V
5 series · 5 of 5 positions shown · non-contrast
Comparison: None.

CLINICAL DATA: Severe lower back pain for 3 days. Pain worse in the
left side.

EXAM:
LUMBAR SPINE - COMPLETE 4+ VIEW

[l-spine ap]
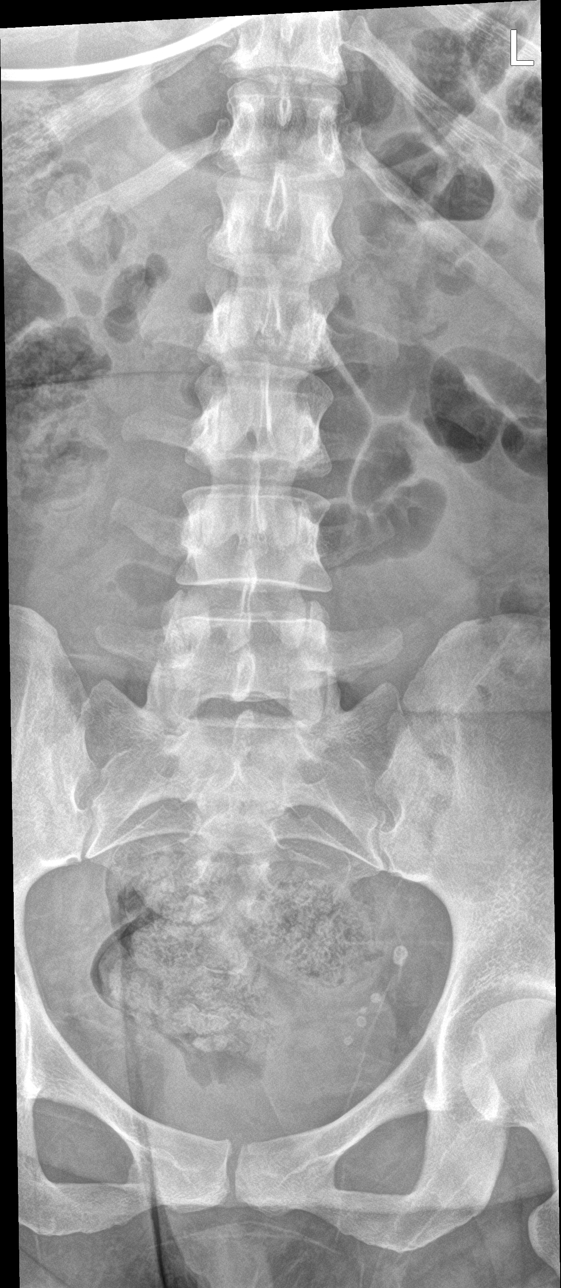

[l-spine obl (1 of 2)]
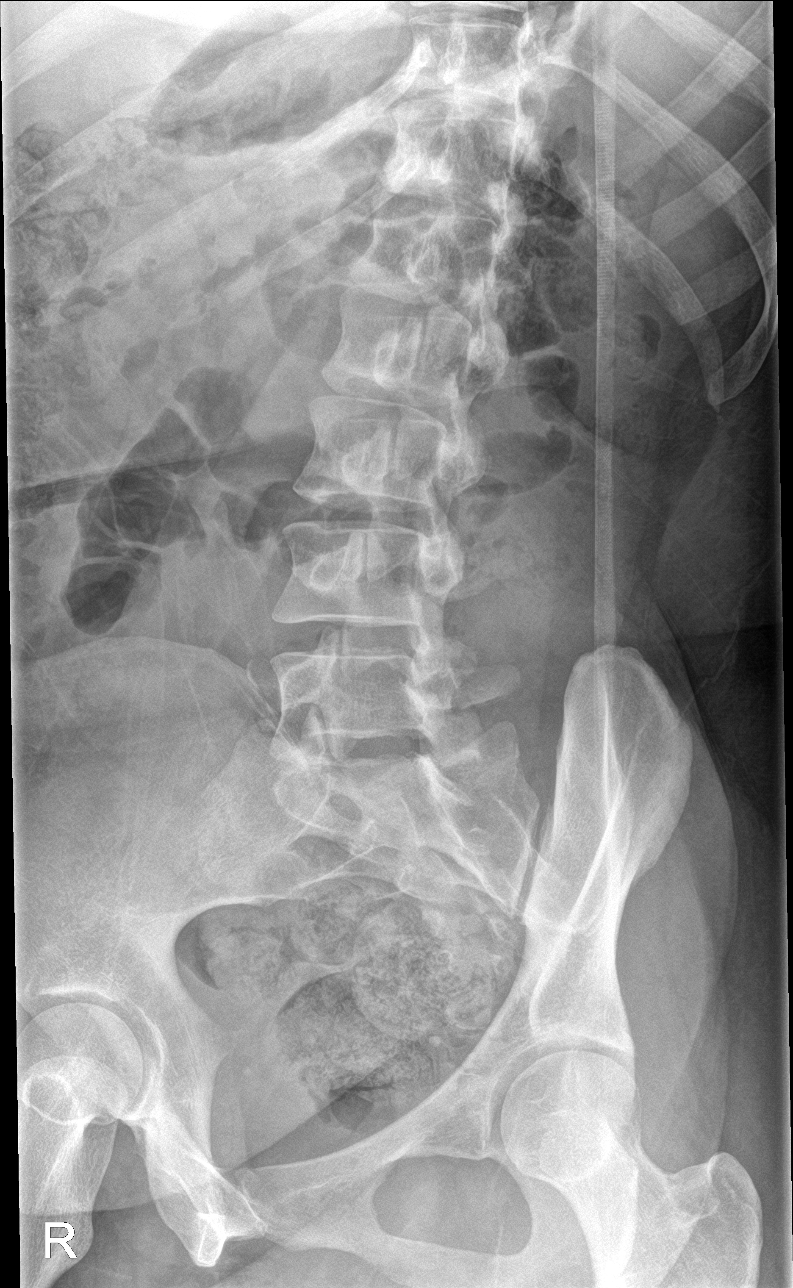

[l-spine obl (2 of 2)]
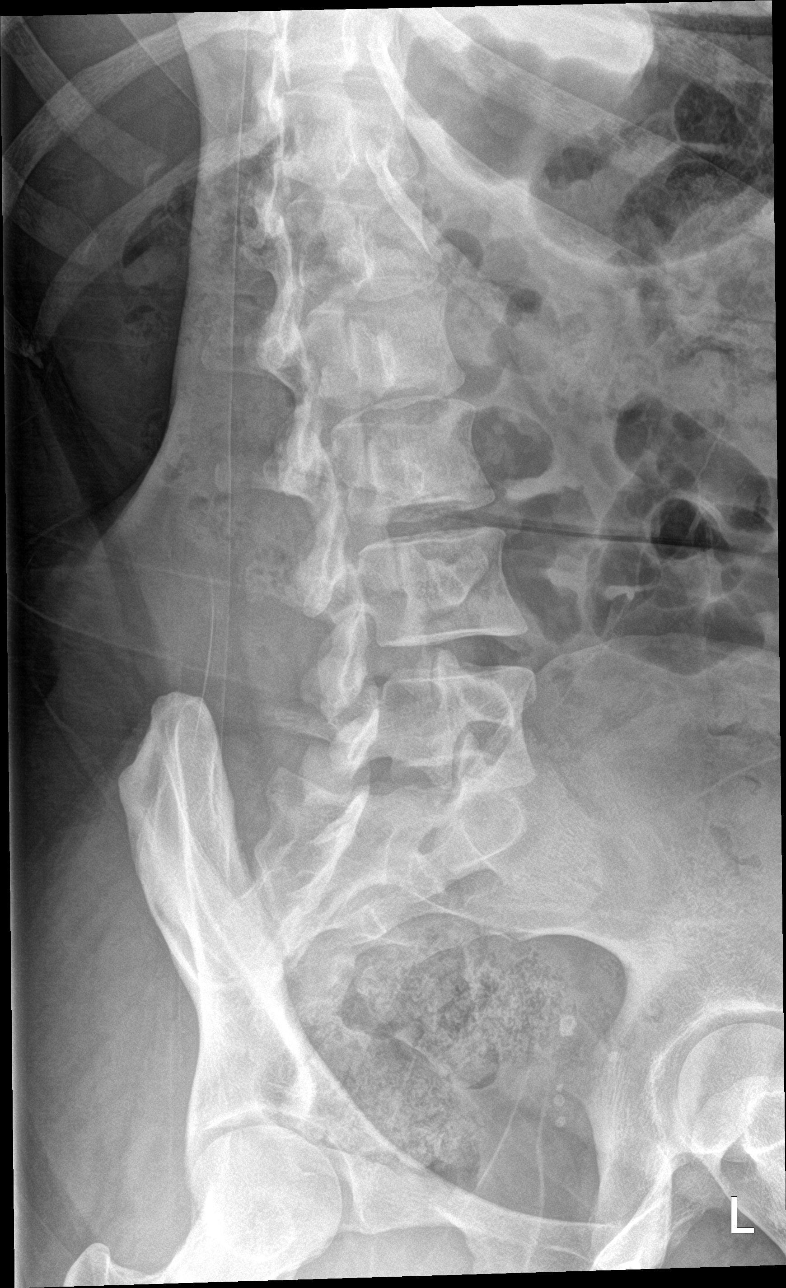

[l-spine lat]
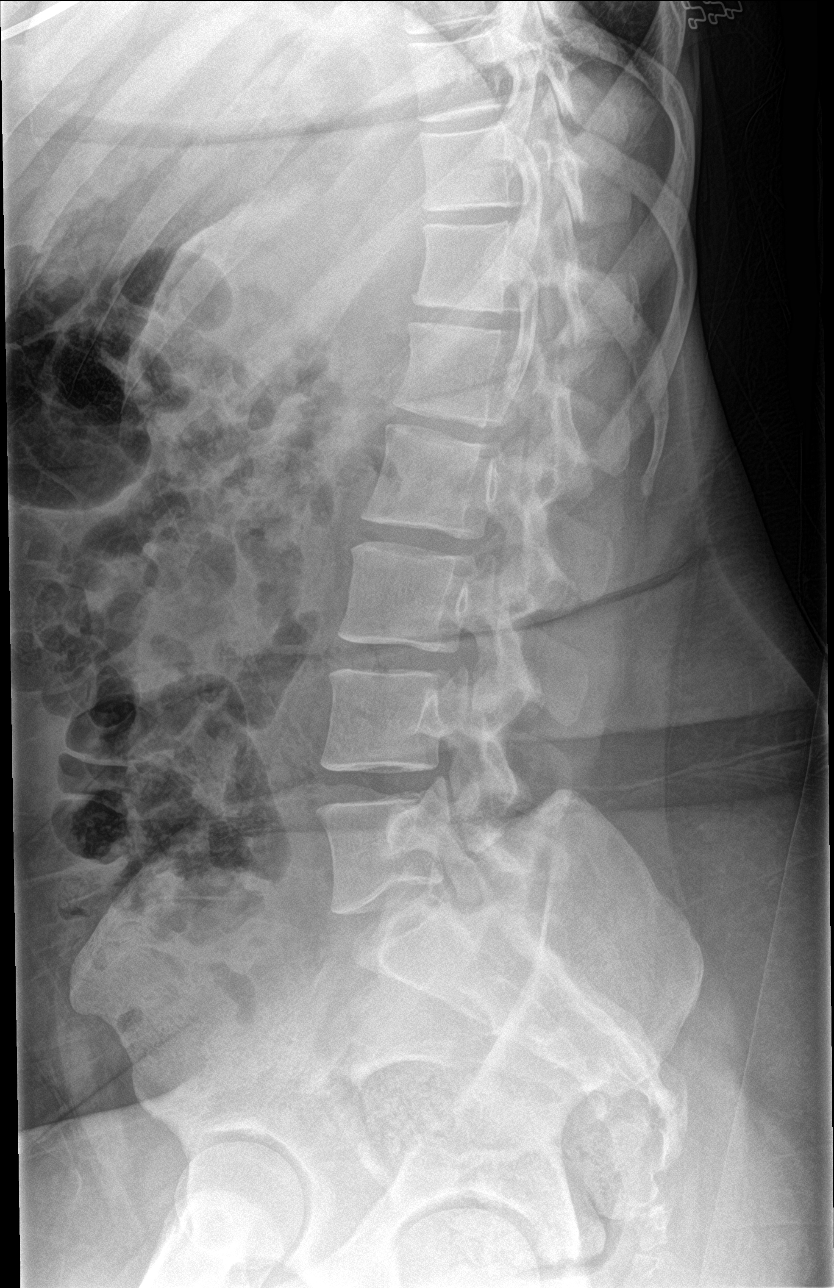

[l-spine spot]
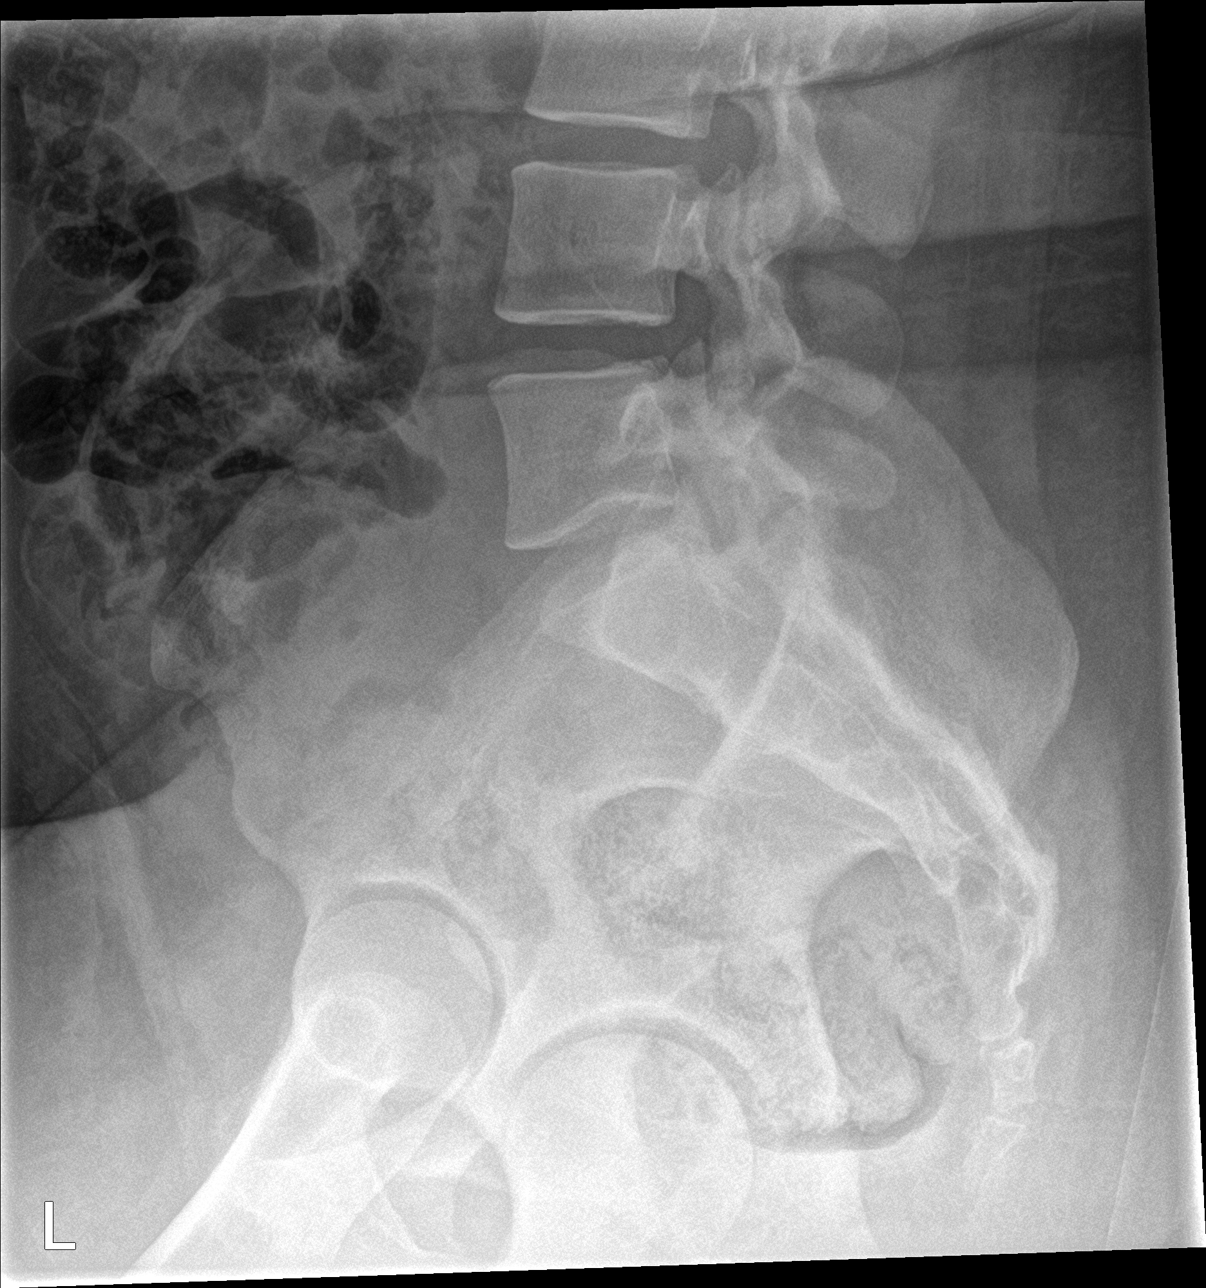

[5 of 5 positions shown; findings below may reference images not displayed]

FINDINGS: There is no evidence of lumbar spine fracture. Alignment is normal.
Intervertebral disc spaces are maintained. No focal bone lesion.
Nonobstructive bowel gas pattern.
IMPRESSION: Negative lumbar spine radiographs.

## 2024-10-19 ENCOUNTER — Encounter (HOSPITAL_COMMUNITY): Payer: Self-pay | Admitting: *Deleted

## 2024-10-19 ENCOUNTER — Other Ambulatory Visit: Payer: Self-pay

## 2024-10-19 ENCOUNTER — Emergency Department (HOSPITAL_COMMUNITY)
Admission: EM | Admit: 2024-10-19 | Discharge: 2024-10-20 | Disposition: A | Discharge status: Home / Self Care | Payer: Self-pay | Attending: Emergency Medicine | Admitting: Emergency Medicine

## 2024-10-19 DIAGNOSIS — N939 Abnormal uterine and vaginal bleeding, unspecified: Secondary | ICD-10-CM | POA: Insufficient documentation

## 2024-10-19 LAB — URINALYSIS, ROUTINE W REFLEX MICROSCOPIC: RBC / HPF: >50 RBC/hpf (ref 0–5)

## 2024-10-19 LAB — COMPREHENSIVE METABOLIC PANEL WITH GFR
ALT: 13 U/L (ref 0–44)
AST: 25 U/L (ref 15–41)
Albumin: 4.5 g/dL (ref 3.5–5.0)
Alkaline Phosphatase: 72 U/L (ref 38–126)
Anion gap: 12 (ref 5–15)
BUN: 11 mg/dL (ref 6–20)
CO2: 23 mmol/L (ref 22–32)
Calcium: 9.2 mg/dL (ref 8.9–10.3)
Chloride: 104 mmol/L (ref 98–111)
Creatinine, Ser: 0.59 mg/dL (ref 0.44–1.00)
GFR, Estimated: >60 mL/min
Glucose, Bld: 92 mg/dL (ref 70–99)
Potassium: 3.8 mmol/L (ref 3.5–5.1)
Sodium: 138 mmol/L (ref 135–145)
Total Bilirubin: 0.5 mg/dL (ref 0.0–1.2)
Total Protein: 8 g/dL (ref 6.5–8.1)

## 2024-10-19 LAB — CBC WITH DIFFERENTIAL/PLATELET
Abs Immature Granulocytes: 0.02 K/uL (ref 0.00–0.07)
Basophils Absolute: 0 K/uL (ref 0.0–0.1)
Basophils Relative: 0 %
Eosinophils Absolute: 0.1 K/uL (ref 0.0–0.5)
Eosinophils Relative: 1 %
HCT: 36.9 % (ref 36.0–46.0)
Hemoglobin: 11.7 g/dL — ABNORMAL LOW (ref 12.0–15.0)
Immature Granulocytes: 0 %
Lymphocytes Relative: 27 %
Lymphs Abs: 2.1 K/uL (ref 0.7–4.0)
MCH: 26.2 pg (ref 26.0–34.0)
MCHC: 31.7 g/dL (ref 30.0–36.0)
MCV: 82.7 fL (ref 80.0–100.0)
Monocytes Absolute: 0.5 K/uL (ref 0.1–1.0)
Monocytes Relative: 6 %
Neutro Abs: 5.3 K/uL (ref 1.7–7.7)
Neutrophils Relative %: 66 %
Platelets: 277 K/uL (ref 150–400)
RBC: 4.46 MIL/uL (ref 3.87–5.11)
RDW: 13.7 % (ref 11.5–15.5)
WBC: 8 K/uL (ref 4.0–10.5)
nRBC: 0 % (ref 0.0–0.2)

## 2024-10-19 LAB — SAMPLE TO BLOOD BANK

## 2024-10-19 LAB — HCG, SERUM, QUALITATIVE: Preg, Serum: NEGATIVE

## 2024-10-19 LAB — PROTIME-INR
INR: 1 (ref 0.8–1.2)
Prothrombin Time: 13.7 s (ref 11.4–15.2)

## 2024-10-19 NOTE — ED Triage Notes (Signed)
 The pt has had vaginal bleeding since October 25 but for the past week her bleeding has been very heavy  she has had dizziness  and weakness  lmp sept 25 last normal period

## 2024-10-20 LAB — WET PREP, GENITAL
Clue Cells Wet Prep HPF POC: NONE SEEN
Sperm: NONE SEEN
Trich, Wet Prep: NONE SEEN
WBC, Wet Prep HPF POC: <10
Yeast Wet Prep HPF POC: NONE SEEN

## 2024-10-20 MED ORDER — MEGESTROL ACETATE 20 MG PO TABS: 20.0000 mg | ORAL_TABLET | Freq: Three times a day (TID) | ORAL | 0 refills | Status: AC

## 2024-10-20 NOTE — ED Notes (Signed)
 Pt ambulated to bathroom. Tolerated well.

## 2024-10-20 NOTE — Discharge Instructions (Signed)
 Fue atendida [employer] [facility] por sangrado uterino. Sus anlisis de sangre y orina estn bien. La prueba de embarazo dio negativa. Los resultados de la muestra vaginal an estn pendientes. Le hemos recetado un medicamento llamado Megace  para ayudar a air traffic controller. Por favor, recoja este medicamento en la farmacia de [employer] y comience a tomarlo segn las indicaciones para ayudar a warehouse manager. Debe programar una cita de seguimiento con el [facility]. Llame a la clnica para programar una cita. Regrese al [employer] de [facility] si presenta dolor intenso, sangrado abundante u otros sntomas preocupantes.  You were seen in the Emergency Department for uterine bleeding Your blood work urine test look okay Your pregnancy test was negative The results of your pelvic swab testing are still pending We have called in a prescription for medication called Megace  to help with your bleeding Please pick up this medication from your [facility] pharmacy and begin taking as directed to help slow the bleeding Need to follow-up with the gynecology Center of [facility] Call the office to make an appointment Return to the Emergency Department for severe pain heavy bleeding or other concerns

## 2024-10-20 NOTE — ED Provider Notes (Signed)
 " [place] EMERGENCY DEPARTMENT AT [facility] Provider Note   CSN: [id number] Arrival date & time: 10/19/24  2146     Patient presents with: Vaginal Bleeding   Valerie Wong is a 39 y.o. G3P3 female.  With a history of status post tubal ligation who presents to ED for vaginal bleeding.  Patient has experienced episodes of heavy bleeding over the last 3 months.  Current episode of bleeding began shortly after Thanksgiving and has persisted.  She is soaking through multiple pads daily.  She does not have a primary doctor nor has she seen a gynecologist for this reason.  Under the recommendation of her sister, she has been taking combined OCPs over the last week but this has not slowed the bleeding.  No other vaginal discharge, abdominal pain, pelvic pain urinary symptoms.  No known history or known family history of coagulopathy.  Sexually active with 1 partner.  No prior history of STIs.    Vaginal Bleeding      Prior to Admission medications  Medication Sig Start Date End Date Taking? Authorizing Provider  megestrol  (MEGACE ) 20 MG tablet Take 1 tablet (20 mg total) by mouth 3 (three) times daily for 7 days. 10/20/24 10/27/24 Yes Pamella Sharper A, DO  Acetaminophen  (TYLENOL  PO) Take by mouth as needed. Patient not taking: Reported on 10/20/2024    [provider]  methocarbamol  (ROBAXIN ) 750 MG tablet Take 1 tablet (750 mg total) by mouth 3 (three) times daily as needed (muscle spasm/pain). Patient not taking: Reported on 10/20/2024 03/14/20   Steinl, Kevin, MD  SUMAtriptan  (IMITREX ) 100 MG tablet Take 1 tablet (100 mg total) by mouth every 2 (two) hours as needed for migraine. May repeat in 2 hours if headache persists or recurs. Patient not taking: Reported on 10/20/2024 01/24/17   Onita Duos, MD  topiramate  (TOPAMAX ) 100 MG tablet Take 1 tablet (100 mg total) by mouth 2 (two) times daily. Patient not taking: Reported on 10/20/2024 01/24/17   Onita Duos, MD    Allergies:  Patient has no known allergies.    Review of Systems  Genitourinary:  Positive for vaginal bleeding.    Updated Vital Signs BP 110/66   Pulse 72   Temp 98.1 F (36.7 C) (Oral)   Resp 18   Ht 5' 3 (1.6 m)   Wt 104.8 kg   LMP 06/24/2024   SpO2 100%   BMI 40.93 kg/m   Physical Exam Vitals and nursing note reviewed.  HENT:     Head: Normocephalic and atraumatic.  Eyes:     Pupils: Pupils are equal, round, and reactive to light.  Cardiovascular:     Rate and Rhythm: Normal rate and regular rhythm.  Pulmonary:     Effort: Pulmonary effort is normal.     Breath sounds: Normal breath sounds.  Abdominal:     Palpations: Abdomen is soft.     Tenderness: There is no abdominal tenderness.  Genitourinary:    Comments: Dark red blood within vaginal introitus No lesions Skin:    General: Skin is warm and dry.  Neurological:     Mental Status: She is alert.  Psychiatric:        Mood and Affect: Mood normal.     (all labs ordered are listed, but only abnormal results are displayed) Labs Reviewed  CBC WITH DIFFERENTIAL/PLATELET - Abnormal; Notable for the following components:      Result Value   Hemoglobin 11.7 (*)    All other components  within normal limits  URINALYSIS, ROUTINE W REFLEX MICROSCOPIC - Abnormal; Notable for the following components:   Color, Urine RED (*)    APPearance TURBID (*)    Glucose, UA   (*)    Value: TEST NOT REPORTED DUE TO COLOR INTERFERENCE OF URINE PIGMENT   Hgb urine dipstick   (*)    Value: TEST NOT REPORTED DUE TO COLOR INTERFERENCE OF URINE PIGMENT   Bilirubin Urine   (*)    Value: TEST NOT REPORTED DUE TO COLOR INTERFERENCE OF URINE PIGMENT   Ketones, ur   (*)    Value: TEST NOT REPORTED DUE TO COLOR INTERFERENCE OF URINE PIGMENT   Protein, ur   (*)    Value: TEST NOT REPORTED DUE TO COLOR INTERFERENCE OF URINE PIGMENT   Nitrite   (*)    Value: TEST NOT REPORTED DUE TO COLOR INTERFERENCE OF URINE PIGMENT   Leukocytes,Ua   (*)     Value: TEST NOT REPORTED DUE TO COLOR INTERFERENCE OF URINE PIGMENT   Bacteria, UA FEW (*)    All other components within normal limits  WET PREP, GENITAL  HCG, SERUM, QUALITATIVE  PROTIME-INR  COMPREHENSIVE METABOLIC PANEL WITH GFR  SAMPLE TO BLOOD BANK  GC/CHLAMYDIA PROBE AMP ([place]) NOT AT [facility]    EKG: None  Radiology: No results found.   Procedures   Medications Ordered in the ED - No data to display  Clinical Course as of 10/20/24 0920  Mon Oct 20, 2024  9081 Have not heard back from gynecology service yet.  Pelvic exam completed.  Will send GC chlamydia wet prep swabs.  Will start on Megace  and instruct for outpatient follow-up with gynecology in the office [MP]    Clinical Course User Index [MP] Pamella Ozell LABOR, DO                                 Medical Decision Making 39 year old female with history as above presented to the ED for heavy vaginal bleeding.  Episodic bleeding over the last 3 months with this episode beginning shortly after Thanksgiving and persisting since then.  Hemodynamically stable.  H&H stable.  No discomfort.  Differential diagnoses for heavy vaginal bleeding include adenomyosis, leiomyoma, coagulopathy, endometriosis, ovulatory dysfunction and malignancy.  Does not have established primary care doctor or gynecologist.  Will discuss with OB/GYN regarding best plan for close follow-up care  Amount and/or Complexity of Data Reviewed Labs: ordered.  Risk Prescription drug management.        Final diagnoses:  Abnormal uterine bleeding (AUB)    ED Discharge Orders          Ordered    megestrol  (MEGACE ) 20 MG tablet  3 times daily        10/20/24 0917               Pamella Ozell LABOR, DO 10/20/24 0920  "

## 2024-10-21 LAB — GC/CHLAMYDIA PROBE AMP (~~LOC~~) NOT AT ARMC
Chlamydia: NEGATIVE
Comment: NEGATIVE
Comment: NORMAL
Neisseria Gonorrhea: NEGATIVE

## 2024-10-31 ENCOUNTER — Other Ambulatory Visit: Payer: Self-pay

## 2024-10-31 ENCOUNTER — Emergency Department (HOSPITAL_COMMUNITY)
Admission: EM | Admit: 2024-10-31 | Discharge: 2024-10-31 | Disposition: A | Discharge status: Home / Self Care | Payer: Self-pay

## 2024-10-31 ENCOUNTER — Emergency Department (HOSPITAL_COMMUNITY): Payer: Self-pay

## 2024-10-31 ENCOUNTER — Encounter (HOSPITAL_COMMUNITY): Payer: Self-pay

## 2024-10-31 DIAGNOSIS — N939 Abnormal uterine and vaginal bleeding, unspecified: Secondary | ICD-10-CM | POA: Insufficient documentation

## 2024-10-31 DIAGNOSIS — D649 Anemia, unspecified: Secondary | ICD-10-CM | POA: Insufficient documentation

## 2024-10-31 LAB — COMPREHENSIVE METABOLIC PANEL WITH GFR
ALT: 10 U/L (ref 0–44)
AST: 22 U/L (ref 15–41)
Albumin: 4.1 g/dL (ref 3.5–5.0)
Alkaline Phosphatase: 61 U/L (ref 38–126)
Anion gap: 9 (ref 5–15)
BUN: 10 mg/dL (ref 6–20)
CO2: 24 mmol/L (ref 22–32)
Calcium: 9.3 mg/dL (ref 8.9–10.3)
Chloride: 103 mmol/L (ref 98–111)
Creatinine, Ser: 0.55 mg/dL (ref 0.44–1.00)
GFR, Estimated: >60 mL/min
Glucose, Bld: 101 mg/dL — ABNORMAL HIGH (ref 70–99)
Potassium: 3.8 mmol/L (ref 3.5–5.1)
Sodium: 136 mmol/L (ref 135–145)
Total Bilirubin: 0.3 mg/dL (ref 0.0–1.2)
Total Protein: 7.5 g/dL (ref 6.5–8.1)

## 2024-10-31 LAB — WET PREP, GENITAL
Clue Cells Wet Prep HPF POC: NONE SEEN
Sperm: NONE SEEN
Trich, Wet Prep: NONE SEEN
WBC, Wet Prep HPF POC: <10
Yeast Wet Prep HPF POC: NONE SEEN

## 2024-10-31 LAB — CBC WITH DIFFERENTIAL/PLATELET
Abs Immature Granulocytes: 0.03 K/uL (ref 0.00–0.07)
Basophils Absolute: 0 K/uL (ref 0.0–0.1)
Basophils Relative: 0 %
Eosinophils Absolute: 0.1 K/uL (ref 0.0–0.5)
Eosinophils Relative: 2 %
HCT: 32.2 % — ABNORMAL LOW (ref 36.0–46.0)
Hemoglobin: 10.3 g/dL — ABNORMAL LOW (ref 12.0–15.0)
Immature Granulocytes: 0 %
Lymphocytes Relative: 24 %
Lymphs Abs: 1.7 K/uL (ref 0.7–4.0)
MCH: 26.1 pg (ref 26.0–34.0)
MCHC: 32 g/dL (ref 30.0–36.0)
MCV: 81.7 fL (ref 80.0–100.0)
Monocytes Absolute: 0.4 K/uL (ref 0.1–1.0)
Monocytes Relative: 5 %
Neutro Abs: 5 K/uL (ref 1.7–7.7)
Neutrophils Relative %: 69 %
Platelets: 256 K/uL (ref 150–400)
RBC: 3.94 MIL/uL (ref 3.87–5.11)
RDW: 13.6 % (ref 11.5–15.5)
WBC: 7.3 K/uL (ref 4.0–10.5)
nRBC: 0 % (ref 0.0–0.2)

## 2024-10-31 LAB — URINALYSIS, ROUTINE W REFLEX MICROSCOPIC
Bacteria, UA: NONE SEEN
Bilirubin Urine: NEGATIVE
Glucose, UA: NEGATIVE mg/dL
Ketones, ur: NEGATIVE mg/dL
Leukocytes,Ua: NEGATIVE
Nitrite: NEGATIVE
Protein, ur: NEGATIVE mg/dL
RBC / HPF: >50 RBC/hpf (ref 0–5)
Specific Gravity, Urine: 1.02 (ref 1.005–1.030)
pH: 5 (ref 5.0–8.0)

## 2024-10-31 LAB — HCG, SERUM, QUALITATIVE: Preg, Serum: NEGATIVE

## 2024-10-31 MED ORDER — MEGESTROL ACETATE 40 MG PO TABS: ORAL_TABLET | ORAL | 0 refills | Status: AC

## 2024-10-31 MED ORDER — ACETAMINOPHEN 500 MG PO TABS
1000.0000 mg | ORAL_TABLET | Freq: Once | ORAL | Status: AC
  Administered 2024-10-31: 1000 mg via ORAL
  Filled 2024-10-31: qty 2

## 2024-10-31 NOTE — ED Provider Notes (Signed)
 " [place] EMERGENCY DEPARTMENT AT [facility] Provider Note   CSN: [id number] Arrival date & time: 10/31/24  1150     Patient presents with: No chief complaint on file.   Valerie Wong is a 39 y.o. female past medical history for headache, vaginal bleeding, previous history of C-section who presents concern for heavy vaginal bleeding since November 1.  She also endorses some increasing weakness, lightheadedness.  She was given a short prescription of Megace , she reports that she took the entire course, but has not had any improvement of the bleeding, since finishing the course she has had some increase in clots, and some increased pelvic pain.  She reports that the bleeding never stopped with the Megace .  She feels like it is getting worse at this time.   HPI     Prior to Admission medications  Medication Sig Start Date End Date Taking? Authorizing Provider  Acetaminophen  (TYLENOL  PO) Take by mouth as needed. Patient not taking: Reported on 10/20/2024    [provider]  methocarbamol  (ROBAXIN ) 750 MG tablet Take 1 tablet (750 mg total) by mouth 3 (three) times daily as needed (muscle spasm/pain). Patient not taking: Reported on 10/20/2024 03/14/20   Steinl, Kevin, MD  SUMAtriptan  (IMITREX ) 100 MG tablet Take 1 tablet (100 mg total) by mouth every 2 (two) hours as needed for migraine. May repeat in 2 hours if headache persists or recurs. Patient not taking: Reported on 10/20/2024 01/24/17   Onita Duos, MD  topiramate  (TOPAMAX ) 100 MG tablet Take 1 tablet (100 mg total) by mouth 2 (two) times daily. Patient not taking: Reported on 10/20/2024 01/24/17   Onita Duos, MD    Allergies: Patient has no known allergies.    Review of Systems  All other systems reviewed and are negative.   Updated Vital Signs BP (!) 131/105 (BP Location: Right Arm)   Pulse 77   Temp 98 F (36.7 C)   Resp 16   Ht 5' 3 (1.6 m)   Wt 120.2 kg   LMP 06/24/2024   SpO2 96%   BMI 46.94 kg/m    Physical Exam Vitals and nursing note reviewed.  Constitutional:      General: She is not in acute distress.    Appearance: Normal appearance.  HENT:     Head: Normocephalic and atraumatic.  Eyes:     General:        Right eye: No discharge.        Left eye: No discharge.  Cardiovascular:     Rate and Rhythm: Normal rate and regular rhythm.     Heart sounds: No murmur heard.    No friction rub. No gallop.  Pulmonary:     Effort: Pulmonary effort is normal.     Breath sounds: Normal breath sounds.  Abdominal:     General: Bowel sounds are normal.     Palpations: Abdomen is soft.     Comments: Some diffuse lower abdominal tenderness to palpation especially diffusely in the adnexa, no rebound, rigidity, guarding  Genitourinary:    Comments: Patient with some clots coming from the cervical os but no overt active bleeding in the vaginal canal Skin:    General: Skin is warm and dry.     Capillary Refill: Capillary refill takes less than 2 seconds.  Neurological:     Mental Status: She is alert and oriented to person, place, and time.  Psychiatric:        Mood and Affect: Mood  normal.        Behavior: Behavior normal.     (all labs ordered are listed, but only abnormal results are displayed) Labs Reviewed  CBC WITH DIFFERENTIAL/PLATELET - Abnormal; Notable for the following components:      Result Value   Hemoglobin 10.3 (*)    HCT 32.2 (*)    All other components within normal limits  COMPREHENSIVE METABOLIC PANEL WITH GFR - Abnormal; Notable for the following components:   Glucose, Bld 101 (*)    All other components within normal limits  URINALYSIS, ROUTINE W REFLEX MICROSCOPIC - Abnormal; Notable for the following components:   APPearance HAZY (*)    Hgb urine dipstick LARGE (*)    All other components within normal limits  WET PREP, GENITAL  HCG, SERUM, QUALITATIVE  GC/CHLAMYDIA PROBE AMP ([place]) NOT AT [facility]    EKG: None  Radiology: No results  found.   Procedures   Medications Ordered in the ED  acetaminophen  (TYLENOL ) tablet 1,000 mg (1,000 mg Oral Given 10/31/24 1255)    Clinical Course as of 10/31/24 1501  Fri Oct 31, 2024  1247 Spoke with MAU from triage, if blood work stable recommend:   Megace  high dose taper   40mg  TID x 3 days  Then twice daily for 3 days  Then once daily for 3 days  [CH]    Clinical Course User Index [CH] Hinnant, Terrall FALCON, PA-C                                 Medical Decision Making Amount and/or Complexity of Data Reviewed Labs: ordered. Radiology: ordered.   This patient is a 39 y.o. female  who presents to the ED for concern of ongoing vaginal bleeding, pelvic pain  Differential diagnoses prior to evaluation: The emergent differential diagnosis includes, but is not limited to, tubo-ovarian abscess, PID, uterine hemorrhage, unstable or critical anemia requiring transfusion,. This is not an exhaustive differential.   Past Medical History / Co-morbidities / Social History: Overall noncontributory, previous C-section, recent evaluation for painless vaginal bleeding  Additional history: Chart reviewed. Pertinent results include: Reviewed lab work, imaging from recent previous emergency department notes.  Physical Exam: Physical exam performed. The pertinent findings include:  Some diffuse lower abdominal tenderness to palpation especially diffusely in the adnexa, no rebound, rigidity, guarding  Genitourinary:    Comments: Patient with some clots coming from the cervical os but no overt active bleeding in the vaginal canal  Lab Tests/Imaging studies: I personally interpreted labs/imaging and the pertinent results include:  CBC notable for worsening anemia, hemoglobin 10.3 from 11.7.  CMP unremarkable, UA unremarkable, wet prep unremarkable.  Negative serum pregnancy test. Pelvic ultrasound pending at time of handoff I agree with the radiologist interpretation.  Medications: Tylenol   given for pain.  Spoke with the MAU who recommended high dose Megace  tamper if blood work overall stable, 40 mg 3 times daily for 3 days then twice daily for 3 days then once daily for 3 days.  She needs close OB/GYN follow-up  3:01 PM Care of Valerie Wong transferred to Resident Physician Toribio and Dr. Patsey at the end of my shift as the patient will require reassessment once labs/imaging have resulted. Patient presentation, ED course, and plan of care discussed with review of all pertinent labs and imaging. Please see his/her note for further details regarding further ED course and disposition. Plan at time of  handoff is pending pelvic ultrasound, reassessment, plan for likely discharge on Megace  taper, close OB/GYN follow-up unless some acute pathology noted on pelvic ultrasound. This may be altered or completely changed at the discretion of the oncoming team pending results of further workup.   Final diagnoses:  None    ED Discharge Orders     None          Rosan Sherlean VEAR DEVONNA 10/31/24 1501    Ula Prentice SAUNDERS, MD 10/31/24 1556  "

## 2024-10-31 NOTE — ED Triage Notes (Addendum)
 Pt reports heavy vaginal bleeding since November 1st. Also reports feeling weak and lightheaded. Reports she was seen on the 4th and given pills to help stop the bleeding but bleeding has continued to increase.

## 2024-10-31 NOTE — Discharge Instructions (Addendum)
 Thank you for letting us  take care of you today.  You came to the emergency department today for evaluation of vaginal bleeding.  Your hemoglobin was slightly lower than when they previously checked it.  We also did an ultrasound that did not show any acute abnormalities.  We did speak to the OB/GYN team who recommended that you go home with another dose of Megace .  Please call your OB/GYN at your earliest convenience so they can see you in the clinic.

## 2024-10-31 NOTE — ED Provider Triage Note (Signed)
 Emergency Medicine Provider Triage Evaluation Note  Valerie Wong , a 39 y.o. female  was evaluated in triage.  Pt complains of abnormal uterine bleeding. Recently seen on 1/4 and discharged on Megace , unfortunately has not seen improvement in bleeding despite completing birth control course. Patient states that after starting the Megace  she has begun to experience pain as well. Did follow-up outpatient with OBGYN however still bleeding. Pain has only been present for 3 days. Appreciates dizziness as well.   Review of Systems  Positive: Abnormal uterine bleeding, pelvic pain Negative: Fever, chills, nausea, vomiting, urinary symptoms  Physical Exam  BP (!) 131/105 (BP Location: Right Arm)   Pulse 77   Temp 98 Wong (36.7 C)   Resp 16   Ht 5' 3 (1.6 m)   Wt 120.2 kg   LMP 06/24/2024   SpO2 96%   BMI 46.94 kg/m  Gen:   Awake, no distress   Resp:  Normal effort  MSK:   Moves extremities without difficulty, mild pelvic pain with palpation Other:    Medical Decision Making  Medically screening exam initiated at 12:36 PM.  Appropriate orders placed.  Valerie Wong was informed that the remainder of the evaluation will be completed by another provider, this initial triage assessment does not replace that evaluation, and the importance of remaining in the ED until their evaluation is complete.  Orders: CBC, CMP, UA, qualitative pregnancy, tylenol   Spoke with MAU from triage, if blood work stable recommend:   Megace  high dose taper   40mg  TID x 3 days  Then twice daily for 3 days  Then once daily for 3 days      Valerie Foerster F, PA-C 10/31/24 1250

## 2024-11-03 LAB — GC/CHLAMYDIA PROBE AMP (~~LOC~~) NOT AT ARMC
Chlamydia: NEGATIVE
Comment: NEGATIVE
Comment: NORMAL
Neisseria Gonorrhea: NEGATIVE
